# Patient Record
Sex: Female | Born: 1940 | Race: White | Hispanic: No | Smoking: Never smoker
Health system: Southern US, Community
[De-identification: ages and names within clinical notes are randomized; demographics above are authoritative.]

## PROBLEM LIST (undated history)

## (undated) DIAGNOSIS — K219 Gastro-esophageal reflux disease without esophagitis: Secondary | ICD-10-CM

## (undated) DIAGNOSIS — Z8719 Personal history of other diseases of the digestive system: Secondary | ICD-10-CM

## (undated) DIAGNOSIS — T8859XA Other complications of anesthesia, initial encounter: Secondary | ICD-10-CM

## (undated) DIAGNOSIS — T4145XA Adverse effect of unspecified anesthetic, initial encounter: Secondary | ICD-10-CM

## (undated) DIAGNOSIS — E119 Type 2 diabetes mellitus without complications: Secondary | ICD-10-CM

## (undated) DIAGNOSIS — Z1211 Encounter for screening for malignant neoplasm of colon: Secondary | ICD-10-CM

## (undated) DIAGNOSIS — N189 Chronic kidney disease, unspecified: Secondary | ICD-10-CM

## (undated) DIAGNOSIS — D0510 Intraductal carcinoma in situ of unspecified breast: Secondary | ICD-10-CM

## (undated) DIAGNOSIS — M1990 Unspecified osteoarthritis, unspecified site: Secondary | ICD-10-CM

## (undated) DIAGNOSIS — Z9889 Other specified postprocedural states: Secondary | ICD-10-CM

## (undated) DIAGNOSIS — I10 Essential (primary) hypertension: Secondary | ICD-10-CM

## (undated) DIAGNOSIS — C801 Malignant (primary) neoplasm, unspecified: Secondary | ICD-10-CM

## (undated) DIAGNOSIS — R112 Nausea with vomiting, unspecified: Secondary | ICD-10-CM

## (undated) DIAGNOSIS — Z9221 Personal history of antineoplastic chemotherapy: Secondary | ICD-10-CM

## (undated) HISTORY — DX: Encounter for screening for malignant neoplasm of colon: Z12.11

## (undated) HISTORY — PX: NASAL FRACTURE SURGERY: SHX718

## (undated) HISTORY — DX: Intraductal carcinoma in situ of unspecified breast: D05.10

## (undated) HISTORY — PX: ABDOMINAL HYSTERECTOMY: SHX81

## (undated) HISTORY — DX: Type 2 diabetes mellitus without complications: E11.9

---
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
## 1980-06-04 HISTORY — PX: OTHER SURGICAL HISTORY: SHX169

## 1985-06-04 HISTORY — PX: CHOLECYSTECTOMY: SHX55

## 2005-06-07 ENCOUNTER — Ambulatory Visit: Payer: Self-pay | Admitting: Internal Medicine

## 2007-01-23 ENCOUNTER — Ambulatory Visit: Payer: Self-pay | Admitting: Internal Medicine

## 2009-02-01 ENCOUNTER — Ambulatory Visit: Payer: Self-pay | Admitting: Internal Medicine

## 2012-03-12 ENCOUNTER — Ambulatory Visit: Payer: Self-pay | Admitting: Internal Medicine

## 2013-11-01 DIAGNOSIS — E1122 Type 2 diabetes mellitus with diabetic chronic kidney disease: Secondary | ICD-10-CM | POA: Insufficient documentation

## 2013-11-01 DIAGNOSIS — I10 Essential (primary) hypertension: Secondary | ICD-10-CM | POA: Insufficient documentation

## 2013-11-01 DIAGNOSIS — E785 Hyperlipidemia, unspecified: Secondary | ICD-10-CM | POA: Insufficient documentation

## 2013-11-01 DIAGNOSIS — E1169 Type 2 diabetes mellitus with other specified complication: Secondary | ICD-10-CM | POA: Insufficient documentation

## 2013-11-01 DIAGNOSIS — M8580 Other specified disorders of bone density and structure, unspecified site: Secondary | ICD-10-CM | POA: Insufficient documentation

## 2014-06-04 DIAGNOSIS — C50919 Malignant neoplasm of unspecified site of unspecified female breast: Secondary | ICD-10-CM

## 2014-06-04 HISTORY — DX: Malignant neoplasm of unspecified site of unspecified female breast: C50.919

## 2014-06-04 HISTORY — PX: BREAST LUMPECTOMY: SHX2

## 2015-03-07 ENCOUNTER — Other Ambulatory Visit: Payer: Self-pay | Admitting: Internal Medicine

## 2015-03-07 DIAGNOSIS — Z1231 Encounter for screening mammogram for malignant neoplasm of breast: Secondary | ICD-10-CM

## 2015-03-15 ENCOUNTER — Other Ambulatory Visit: Payer: Self-pay | Admitting: Internal Medicine

## 2015-03-15 ENCOUNTER — Ambulatory Visit: Payer: Self-pay

## 2015-03-15 ENCOUNTER — Ambulatory Visit
Admission: RE | Admit: 2015-03-15 | Discharge: 2015-03-15 | Disposition: A | Discharge status: Home / Self Care | Payer: Medicare Other | Source: Ambulatory Visit | Attending: Internal Medicine | Admitting: Internal Medicine

## 2015-03-15 DIAGNOSIS — Z1231 Encounter for screening mammogram for malignant neoplasm of breast: Secondary | ICD-10-CM

## 2015-03-15 HISTORY — DX: Malignant (primary) neoplasm, unspecified: C80.1

## 2015-03-21 ENCOUNTER — Other Ambulatory Visit: Payer: Self-pay | Admitting: Internal Medicine

## 2015-03-21 DIAGNOSIS — R928 Other abnormal and inconclusive findings on diagnostic imaging of breast: Secondary | ICD-10-CM

## 2015-03-29 ENCOUNTER — Other Ambulatory Visit: Payer: Self-pay | Admitting: Internal Medicine

## 2015-03-29 DIAGNOSIS — N6489 Other specified disorders of breast: Secondary | ICD-10-CM

## 2015-03-29 DIAGNOSIS — R921 Mammographic calcification found on diagnostic imaging of breast: Secondary | ICD-10-CM

## 2015-04-12 ENCOUNTER — Ambulatory Visit
Admission: RE | Admit: 2015-04-12 | Discharge: 2015-04-12 | Disposition: A | Discharge status: Home / Self Care | Payer: Medicare Other | Source: Ambulatory Visit | Attending: Internal Medicine | Admitting: Internal Medicine

## 2015-04-12 DIAGNOSIS — R928 Other abnormal and inconclusive findings on diagnostic imaging of breast: Secondary | ICD-10-CM | POA: Diagnosis present

## 2015-04-12 DIAGNOSIS — N63 Unspecified lump in breast: Secondary | ICD-10-CM | POA: Diagnosis not present

## 2015-04-12 DIAGNOSIS — R921 Mammographic calcification found on diagnostic imaging of breast: Secondary | ICD-10-CM | POA: Diagnosis not present

## 2015-04-14 ENCOUNTER — Other Ambulatory Visit: Payer: Self-pay | Admitting: Internal Medicine

## 2015-04-14 DIAGNOSIS — N63 Unspecified lump in unspecified breast: Secondary | ICD-10-CM

## 2015-04-14 DIAGNOSIS — R921 Mammographic calcification found on diagnostic imaging of breast: Secondary | ICD-10-CM

## 2015-04-20 ENCOUNTER — Ambulatory Visit
Admission: RE | Admit: 2015-04-20 | Discharge: 2015-04-20 | Disposition: A | Discharge status: Home / Self Care | Payer: Medicare Other | Source: Ambulatory Visit | Attending: Internal Medicine | Admitting: Internal Medicine

## 2015-04-20 DIAGNOSIS — N63 Unspecified lump in unspecified breast: Secondary | ICD-10-CM

## 2015-04-20 DIAGNOSIS — D0581 Other specified type of carcinoma in situ of right breast: Secondary | ICD-10-CM | POA: Insufficient documentation

## 2015-04-20 DIAGNOSIS — R921 Mammographic calcification found on diagnostic imaging of breast: Secondary | ICD-10-CM

## 2015-04-20 HISTORY — PX: BREAST BIOPSY: SHX20

## 2015-04-22 LAB — SURGICAL PATHOLOGY

## 2015-04-25 ENCOUNTER — Encounter: Payer: Self-pay | Admitting: *Deleted

## 2015-05-02 ENCOUNTER — Encounter: Payer: Self-pay | Admitting: General Surgery

## 2015-05-02 ENCOUNTER — Ambulatory Visit: Payer: Medicare Other

## 2015-05-02 ENCOUNTER — Ambulatory Visit (INDEPENDENT_AMBULATORY_CARE_PROVIDER_SITE_OTHER): Payer: Medicare Other | Admitting: General Surgery

## 2015-05-02 VITALS — BP 138/78 | HR 76 | Resp 14 | Ht 64.0 in | Wt 198.0 lb

## 2015-05-02 DIAGNOSIS — D0511 Intraductal carcinoma in situ of right breast: Secondary | ICD-10-CM | POA: Diagnosis not present

## 2015-05-02 NOTE — Patient Instructions (Signed)
Patient's surgery has been scheduled for 05-09-15 at [facility]. It is okay for patient to continue 81 mg aspirin once daily.

## 2015-05-02 NOTE — Progress Notes (Signed)
Patient ID: Janet Schwartz, female   DOB: 28-May-1941, 74 y.o.   MRN: [id number]  Chief Complaint  Patient presents with  . Other    right breast mass    HPI Janet Schwartz is a 74 y.o. female here for discussion of biopsy results. Her last mammogram was on 03/15/15, with added views done. She had a two right breast stereo biopsies done on 04/20/15. She denies any breast pain or tenderness. She did not feel anything in her breast.  There was a 3 year interval between mammograms. She is accompanied by her daughter, Janet Schwartz, who was present for the interview and exam.   I personally confirmed the patient's history. HPI  Past Medical History  Diagnosis Date  . Diabetes mellitus without complication ([place])   . 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 Breast biopsy Right 04/20/2015    stereo & u/s biopsy  . Bladder tach  1982  . Cholecystectomy  1987    Dr Bary Castilla    No family history on file.  Social History Social History  Substance Use Topics  . Smoking status: Never Smoker   . Smokeless tobacco: Never Used  . Alcohol Use: No    Allergies  Allergen Reactions  . Ace Inhibitors Swelling    Current Outpatient Prescriptions  Medication Sig Dispense Refill  . aspirin EC 81 MG tablet Take by mouth.    Marland Kitchen atorvastatin (LIPITOR) 40 MG tablet     . glimepiride (AMARYL) 2 MG tablet     . losartan-hydrochlorothiazide (HYZAAR) 100-12.5 MG tablet Take by mouth.     No current facility-administered medications for this visit.    Review of Systems Review of Systems  Constitutional: Negative.   Respiratory: Negative.   Cardiovascular: Negative.     Blood pressure 138/78, pulse 76, resp. rate 14, height 5\' 4"  (1.626 m), weight 198 lb (89.812 kg).  Physical Exam Physical Exam  Constitutional: She is oriented to person, place, and time. She appears well-developed and well-nourished.  Eyes: Conjunctivae are normal. No  scleral icterus.  Neck: Neck supple.  Cardiovascular: Normal rate, regular rhythm and normal heart sounds.   Pulmonary/Chest: Effort normal and breath sounds normal. Right breast exhibits no inverted nipple, no mass, no nipple discharge, no skin change and no tenderness. Left breast exhibits no inverted nipple, no mass, no nipple discharge, no skin change and no tenderness.  Right breast bruises at 2 and 10 o'clk   Lymphadenopathy:    She has no cervical adenopathy.    She has no axillary adenopathy.  Neurological: She is alert and oriented to person, place, and time.  Skin: Skin is warm and dry.  Psychiatric: She has a normal mood and affect.    Data Reviewed 2013-2016 mammograms were reviewed.  Biopsy results from the upper inner quadrant of the right breast showed intermediate grade DCIS.  Biopsy results from the upper outer quadrant lesion showed no malignancy.  Ultrasound examination of the upper inner quadrant of the right breast was done to help determine location of the biopsy site. There is an ill-defined hypoechoic area with focal acoustic shadowing measuring 1.0 x 1.1 x 1.4 cm in the 2:00 position of the right breast 12 cm from the nipple. This is probably approximately the 2 cm depth. BI-RADS-6  Laboratory studies from her PCP office of 02/28/2015 showed a creatinine of 1.1 with an estimated GFR of 46. Normal electrolytes. Hemoglobin A1c  7.1.     Assessment    DCIS right breast.    Plan    The ultrasound completed today was compared to her post biopsy mammogram, and majority of the residual calcifications remain anterior and lateral to the biopsy site.The area of microcalcifications covers approximately 4 cm. She'll be best served by bracketing wires to completely remove this area.   The indication for wide excision to confirm no upstaging to invasive cancer was reviewed. The potential role for antiestrogen therapy should she be found to have an estrogen sensitive tumor  was discussed. At this time the role for post wide excision radiation therapy is somewhat influx, but she'll likely benefit from formal consultation for radiation oncology. She reports that her family lives into their late 45s or early 71s and for that reason we need to look at a 10+ year disease-free treatment plan.  Based on information available at this time there is no indication that adjuvant chemotherapy would be of benefit.  Approximately 45 minutes was spent reviewing options for management.  Patient's surgery has been scheduled for 05-09-15 at [facility]. It is okay for patient to continue 81 mg aspirin once daily.     PCP:  Sol Passer 05/02/2015, 4:47 PM

## 2015-05-02 NOTE — H&P (Signed)
HPI  Janet Schwartz is a 74 y.o. female here for discussion of biopsy results. Her last mammogram was on 03/15/15, with added views done. She had a two right breast stereo biopsies done on 04/20/15. She denies any breast pain or tenderness. She did not feel anything in her breast.  There was a 3 year interval between mammograms.  She is accompanied by her daughter, Joselyn Glassman, who was present for the interview and exam.  I personally confirmed the patient's history.  HPI  Past Medical History   Diagnosis  Date   .  Diabetes mellitus without complication ([place])    .  Cancer [facility])  70's     cervical    Past Surgical History   Procedure  Laterality  Date   .  Breast biopsy  Right  04/20/2015     stereo & u/s biopsy   .  Bladder tach   1982   .  Cholecystectomy   1987     Dr Bary Castilla    No family history on file.  Social History  Social History   Substance Use Topics   .  Smoking status:  Never Smoker   .  Smokeless tobacco:  Never Used   .  Alcohol Use:  No    Allergies   Allergen  Reactions   .  Ace Inhibitors  Swelling    Current Outpatient Prescriptions   Medication  Sig  Dispense  Refill   .  aspirin EC 81 MG tablet  Take by mouth.     Marland Kitchen  atorvastatin (LIPITOR) 40 MG tablet      .  glimepiride (AMARYL) 2 MG tablet      .  losartan-hydrochlorothiazide (HYZAAR) 100-12.5 MG tablet  Take by mouth.      No current facility-administered medications for this visit.    Review of Systems  Review of Systems  Constitutional: Negative.  Respiratory: Negative.  Cardiovascular: Negative.   Blood pressure 138/78, pulse 76, resp. rate 14, height 5\' 4"  (1.626 m), weight 198 lb (89.812 kg).  Physical Exam  Physical Exam  Constitutional: She is oriented to person, place, and time. She appears well-developed and well-nourished.  Eyes: Conjunctivae are normal. No scleral icterus.  Neck: Neck supple.  Cardiovascular: Normal rate, regular rhythm and normal heart sounds.   Pulmonary/Chest: Effort normal and breath sounds normal. Right breast exhibits no inverted nipple, no mass, no nipple discharge, no skin change and no tenderness. Left breast exhibits no inverted nipple, no mass, no nipple discharge, no skin change and no tenderness.  Right breast bruises at 2 and 10 o'clk  Lymphadenopathy:  She has no cervical adenopathy.  She has no axillary adenopathy.  Neurological: She is alert and oriented to person, place, and time.  Skin: Skin is warm and dry.  Psychiatric: She has a normal mood and affect.   Data Reviewed  2013-2016 mammograms were reviewed.  Biopsy results from the upper inner quadrant of the right breast showed intermediate grade DCIS.  Biopsy results from the upper outer quadrant lesion showed no malignancy.  Ultrasound examination of the upper inner quadrant of the right breast was done to help determine location of the biopsy site. There is an ill-defined hypoechoic area with focal acoustic shadowing measuring 1.0 x 1.1 x 1.4 cm in the 2:00 position of the right breast 12 cm from the nipple. This is probably approximately the 2 cm depth. BI-RADS-6  Laboratory studies from her PCP office of 02/28/2015 showed a creatinine  of 1.1 with an estimated GFR of 46. Normal electrolytes. Hemoglobin A1c 7.1.  Assessment   DCIS right breast.   Plan   The ultrasound completed today was compared to her post biopsy mammogram, and majority of the residual calcifications remain anterior and lateral to the biopsy site.The area of microcalcifications covers approximately 4 cm. She'll be best served by bracketing wires to completely remove this area.  The indication for wide excision to confirm no upstaging to invasive cancer was reviewed. The potential role for antiestrogen therapy should she be found to have an estrogen sensitive tumor was discussed. At this time the role for post wide excision radiation therapy is somewhat influx, but she'll likely benefit from  formal consultation for radiation oncology. She reports that her family lives into their late 67s or early 57s and for that reason we need to look at a 10+ year disease-free treatment plan.  Based on information available at this time there is no indication that adjuvant chemotherapy would be of benefit.  Approximately 45 minutes was spent reviewing options for management.  Patient's surgery has been scheduled for 05-09-15 at [facility]. It is okay for patient to continue 81 mg aspirin once daily.   PCP: Sol Passer  05/02/2015, 4:47 PM

## 2015-05-03 ENCOUNTER — Telehealth: Payer: Self-pay

## 2015-05-03 NOTE — Telephone Encounter (Signed)
Phoned patient to introduce navigation service. Took Breast Cancer Treatment Handbook/folder with hospital services to Dr. Dwyane Luo office yesterday while patient was with him for initial surgery consult.  She is scheduled for lumpectomy on 05/09/15.  Reviewed family history, and risk data for Breast Case [facility]  Oncology Nurse Navigator Documentation    Navigator Encounter Type: Introductory phone call (05/03/15 1000) Patient Visit Type: Initial (05/03/15 1000) Treatment Phase:  (Surgery) (05/03/15 1000) Barriers/Navigation Needs: No barriers at this time (05/03/15 1000)   Interventions: Education Method (05/03/15 1000)     Education Method: Verbal (05/03/15 1000)      Time Spent with Patient: 30 (05/03/15 1000)   ce.

## 2015-05-04 ENCOUNTER — Other Ambulatory Visit: Payer: Medicare Other

## 2015-05-04 ENCOUNTER — Other Ambulatory Visit: Payer: Self-pay | Admitting: General Surgery

## 2015-05-04 ENCOUNTER — Encounter: Payer: Self-pay | Admitting: *Deleted

## 2015-05-04 DIAGNOSIS — C50911 Malignant neoplasm of unspecified site of right female breast: Secondary | ICD-10-CM

## 2015-05-06 ENCOUNTER — Encounter
Admission: RE | Admit: 2015-05-06 | Discharge: 2015-05-06 | Disposition: A | Discharge status: Home / Self Care | Payer: Medicare Other | Source: Ambulatory Visit | Attending: Anesthesiology | Admitting: Anesthesiology

## 2015-05-06 ENCOUNTER — Encounter: Payer: Self-pay | Admitting: *Deleted

## 2015-05-06 DIAGNOSIS — E119 Type 2 diabetes mellitus without complications: Secondary | ICD-10-CM | POA: Diagnosis not present

## 2015-05-06 DIAGNOSIS — K449 Diaphragmatic hernia without obstruction or gangrene: Secondary | ICD-10-CM | POA: Diagnosis not present

## 2015-05-06 DIAGNOSIS — I129 Hypertensive chronic kidney disease with stage 1 through stage 4 chronic kidney disease, or unspecified chronic kidney disease: Secondary | ICD-10-CM | POA: Diagnosis not present

## 2015-05-06 DIAGNOSIS — D0511 Intraductal carcinoma in situ of right breast: Secondary | ICD-10-CM | POA: Diagnosis present

## 2015-05-06 DIAGNOSIS — Z8541 Personal history of malignant neoplasm of cervix uteri: Secondary | ICD-10-CM | POA: Diagnosis not present

## 2015-05-06 DIAGNOSIS — N183 Chronic kidney disease, stage 3 (moderate): Secondary | ICD-10-CM | POA: Diagnosis not present

## 2015-05-06 DIAGNOSIS — E1122 Type 2 diabetes mellitus with diabetic chronic kidney disease: Secondary | ICD-10-CM | POA: Diagnosis not present

## 2015-05-06 DIAGNOSIS — R920 Mammographic microcalcification found on diagnostic imaging of breast: Secondary | ICD-10-CM | POA: Diagnosis not present

## 2015-05-06 DIAGNOSIS — Z79899 Other long term (current) drug therapy: Secondary | ICD-10-CM | POA: Diagnosis not present

## 2015-05-06 DIAGNOSIS — Z170 Estrogen receptor positive status [ER+]: Secondary | ICD-10-CM | POA: Diagnosis not present

## 2015-05-06 DIAGNOSIS — K219 Gastro-esophageal reflux disease without esophagitis: Secondary | ICD-10-CM | POA: Diagnosis not present

## 2015-05-06 DIAGNOSIS — Z9049 Acquired absence of other specified parts of digestive tract: Secondary | ICD-10-CM | POA: Diagnosis not present

## 2015-05-06 DIAGNOSIS — Z7982 Long term (current) use of aspirin: Secondary | ICD-10-CM | POA: Diagnosis not present

## 2015-05-06 LAB — BASIC METABOLIC PANEL
ANION GAP: 7 (ref 5–15)
BUN: 24 mg/dL — ABNORMAL HIGH (ref 6–20)
CO2: 26 mmol/L (ref 22–32)
Calcium: 9.1 mg/dL (ref 8.9–10.3)
Chloride: 106 mmol/L (ref 101–111)
Creatinine, Ser: 0.99 mg/dL (ref 0.44–1.00)
GFR, EST NON AFRICAN AMERICAN: 54 mL/min — AB (ref >60)
Glucose, Bld: 113 mg/dL — ABNORMAL HIGH (ref 65–99)
POTASSIUM: 3.7 mmol/L (ref 3.5–5.1)
SODIUM: 139 mmol/L (ref 135–145)

## 2015-05-06 NOTE — Patient Instructions (Signed)
  Your procedure is scheduled on: 05-09-15 (MONDAY) Report to [place] @ 7:45 AM PER PT   Remember: Instructions that are not followed completely may result in serious medical risk, up to and including death, or upon the discretion of your surgeon and anesthesiologist your surgery may need to be rescheduled.    _X___ 1. Do not eat food or drink liquids after midnight. No gum chewing or hard candies.     _X___ 2. No Alcohol for 24 hours before or after surgery.   ____ 3. Bring all medications with you on the day of surgery if instructed.    _X___ 4. Notify your doctor if there is any change in your medical condition     (cold, fever, infections).     Do not wear jewelry, make-up, hairpins, clips or nail polish.  Do not wear lotions, powders, or perfumes. You may wear deodorant.  Do not shave 48 hours prior to surgery. Men may shave face and neck.  Do not bring valuables to the hospital.    [facility] is not responsible for any belongings or valuables.               Contacts, dentures or bridgework may not be worn into surgery.  Leave your suitcase in the car. After surgery it may be brought to your room.  For patients admitted to the hospital, discharge time is determined by your treatment team.   Patients discharged the day of surgery will not be allowed to drive home.   Please read over the following fact sheets that you were given:     _X_ Take these medicines the morning of surgery with A SIP OF WATER:    1. ATORVASTATIN (LIPITOR)  2.   3.   4.  5.  6.  ____ Fleet Enema (as directed)   _X___ Use CHG Soap as directed  ____ Use inhalers on the day of surgery  ____ Stop metformin 2 days prior to surgery    ____ Take 1/2 of usual insulin dose the night before surgery and none on the morning of surgery.   ____ Stop Coumadin/Plavix/aspirin-OK TO CONTINUE 81 MG ASPIRIN PER DR BYRNETT-DO NOT TAKE ASPIRIN DAY OF SURGERY  ____ Stop  Anti-inflammatories   ____ Stop supplements until after surgery.    ____ Bring C-Pap to the hospital.

## 2015-05-09 ENCOUNTER — Ambulatory Visit
Admission: RE | Admit: 2015-05-09 | Discharge: 2015-05-09 | Disposition: A | Discharge status: Home / Self Care | Payer: Medicare Other | Source: Ambulatory Visit | Attending: General Surgery | Admitting: General Surgery

## 2015-05-09 ENCOUNTER — Ambulatory Visit: Payer: Medicare Other | Admitting: Anesthesiology

## 2015-05-09 ENCOUNTER — Encounter
Admission: RE | Disposition: A | Discharge status: Home / Self Care | Payer: Self-pay | Source: Ambulatory Visit | Attending: General Surgery

## 2015-05-09 ENCOUNTER — Encounter: Payer: Self-pay | Admitting: *Deleted

## 2015-05-09 DIAGNOSIS — Z7982 Long term (current) use of aspirin: Secondary | ICD-10-CM | POA: Insufficient documentation

## 2015-05-09 DIAGNOSIS — Z8541 Personal history of malignant neoplasm of cervix uteri: Secondary | ICD-10-CM | POA: Insufficient documentation

## 2015-05-09 DIAGNOSIS — D0510 Intraductal carcinoma in situ of unspecified breast: Secondary | ICD-10-CM

## 2015-05-09 DIAGNOSIS — K219 Gastro-esophageal reflux disease without esophagitis: Secondary | ICD-10-CM | POA: Insufficient documentation

## 2015-05-09 DIAGNOSIS — K449 Diaphragmatic hernia without obstruction or gangrene: Secondary | ICD-10-CM | POA: Insufficient documentation

## 2015-05-09 DIAGNOSIS — C50411 Malignant neoplasm of upper-outer quadrant of right female breast: Secondary | ICD-10-CM

## 2015-05-09 DIAGNOSIS — Z9049 Acquired absence of other specified parts of digestive tract: Secondary | ICD-10-CM | POA: Insufficient documentation

## 2015-05-09 DIAGNOSIS — D0511 Intraductal carcinoma in situ of right breast: Secondary | ICD-10-CM | POA: Insufficient documentation

## 2015-05-09 DIAGNOSIS — C50911 Malignant neoplasm of unspecified site of right female breast: Secondary | ICD-10-CM

## 2015-05-09 DIAGNOSIS — E1122 Type 2 diabetes mellitus with diabetic chronic kidney disease: Secondary | ICD-10-CM | POA: Insufficient documentation

## 2015-05-09 DIAGNOSIS — N183 Chronic kidney disease, stage 3 (moderate): Secondary | ICD-10-CM | POA: Insufficient documentation

## 2015-05-09 DIAGNOSIS — R920 Mammographic microcalcification found on diagnostic imaging of breast: Secondary | ICD-10-CM | POA: Insufficient documentation

## 2015-05-09 DIAGNOSIS — E119 Type 2 diabetes mellitus without complications: Secondary | ICD-10-CM | POA: Insufficient documentation

## 2015-05-09 DIAGNOSIS — I129 Hypertensive chronic kidney disease with stage 1 through stage 4 chronic kidney disease, or unspecified chronic kidney disease: Secondary | ICD-10-CM | POA: Insufficient documentation

## 2015-05-09 DIAGNOSIS — Z79899 Other long term (current) drug therapy: Secondary | ICD-10-CM | POA: Insufficient documentation

## 2015-05-09 DIAGNOSIS — Z170 Estrogen receptor positive status [ER+]: Secondary | ICD-10-CM | POA: Insufficient documentation

## 2015-05-09 HISTORY — DX: Nausea with vomiting, unspecified: R11.2

## 2015-05-09 HISTORY — DX: Essential (primary) hypertension: I10

## 2015-05-09 HISTORY — DX: Gastro-esophageal reflux disease without esophagitis: K21.9

## 2015-05-09 HISTORY — DX: Intraductal carcinoma in situ of unspecified breast: D05.10

## 2015-05-09 HISTORY — PX: BREAST LUMPECTOMY WITH NEEDLE LOCALIZATION: SHX5759

## 2015-05-09 HISTORY — DX: Personal history of other diseases of the digestive system: Z87.19

## 2015-05-09 HISTORY — DX: Other specified postprocedural states: Z98.890

## 2015-05-09 HISTORY — DX: Other complications of anesthesia, initial encounter: T88.59XA

## 2015-05-09 HISTORY — DX: Adverse effect of unspecified anesthetic, initial encounter: T41.45XA

## 2015-05-09 HISTORY — DX: Chronic kidney disease, unspecified: N18.9

## 2015-05-09 LAB — GLUCOSE, CAPILLARY
Glucose-Capillary: 161 mg/dL — ABNORMAL HIGH (ref 65–99)
Glucose-Capillary: 190 mg/dL — ABNORMAL HIGH (ref 65–99)

## 2015-05-09 SURGERY — BREAST LUMPECTOMY WITH NEEDLE LOCALIZATION
Anesthesia: General | Laterality: Right | Wound class: Clean

## 2015-05-09 MED ORDER — MIDAZOLAM HCL 2 MG/2ML IJ SOLN
INTRAMUSCULAR | Status: DC | PRN
  Administered 2015-05-09: 2 mg via INTRAVENOUS

## 2015-05-09 MED ORDER — KETOROLAC TROMETHAMINE 30 MG/ML IJ SOLN
INTRAMUSCULAR | Status: DC | PRN
  Administered 2015-05-09: 30 mg via INTRAVENOUS

## 2015-05-09 MED ORDER — ACETAMINOPHEN 10 MG/ML IV SOLN
INTRAVENOUS | Status: AC
  Filled 2015-05-09: qty 100

## 2015-05-09 MED ORDER — OXYCODONE HCL 5 MG/5ML PO SOLN: 5.0000 mg | Freq: Once | ORAL | Status: DC | PRN

## 2015-05-09 MED ORDER — ONDANSETRON HCL 4 MG/2ML IJ SOLN
INTRAMUSCULAR | Status: DC | PRN
  Administered 2015-05-09: 4 mg via INTRAVENOUS

## 2015-05-09 MED ORDER — BUPIVACAINE-EPINEPHRINE (PF) 0.5% -1:200000 IJ SOLN
INTRAMUSCULAR | Status: AC
  Filled 2015-05-09: qty 30

## 2015-05-09 MED ORDER — HYDROCODONE-ACETAMINOPHEN 5-325 MG PO TABS: 1.0000 | ORAL_TABLET | Freq: Four times a day (QID) | ORAL | Status: DC | PRN

## 2015-05-09 MED ORDER — FAMOTIDINE 20 MG PO TABS
20.0000 mg | ORAL_TABLET | Freq: Once | ORAL | Status: AC
  Administered 2015-05-09: 20 mg via ORAL

## 2015-05-09 MED ORDER — FAMOTIDINE 20 MG PO TABS
ORAL_TABLET | ORAL | Status: AC
  Administered 2015-05-09: 20 mg via ORAL
  Filled 2015-05-09: qty 1

## 2015-05-09 MED ORDER — FENTANYL CITRATE (PF) 100 MCG/2ML IJ SOLN
INTRAMUSCULAR | Status: DC | PRN
  Administered 2015-05-09 (×2): 50 ug via INTRAVENOUS

## 2015-05-09 MED ORDER — FENTANYL CITRATE (PF) 100 MCG/2ML IJ SOLN: 25.0000 ug | INTRAMUSCULAR | Status: DC | PRN

## 2015-05-09 MED ORDER — OXYCODONE HCL 5 MG PO TABS: 5.0000 mg | ORAL_TABLET | Freq: Once | ORAL | Status: DC | PRN

## 2015-05-09 MED ORDER — LIDOCAINE HCL (CARDIAC) 20 MG/ML IV SOLN
INTRAVENOUS | Status: DC | PRN
  Administered 2015-05-09: 100 mg via INTRAVENOUS

## 2015-05-09 MED ORDER — ACETAMINOPHEN 10 MG/ML IV SOLN
INTRAVENOUS | Status: DC | PRN
  Administered 2015-05-09: 1000 mg via INTRAVENOUS

## 2015-05-09 MED ORDER — SODIUM CHLORIDE 0.9 % IV SOLN
INTRAVENOUS | Status: DC
  Administered 2015-05-09: 09:00:00 via INTRAVENOUS

## 2015-05-09 MED ORDER — BUPIVACAINE-EPINEPHRINE (PF) 0.5% -1:200000 IJ SOLN
INTRAMUSCULAR | Status: DC | PRN
  Administered 2015-05-09: 30 mL

## 2015-05-09 MED ORDER — SODIUM CHLORIDE 0.9 % IV SOLN: INTRAVENOUS | Status: DC | PRN

## 2015-05-09 MED ORDER — DEXAMETHASONE SODIUM PHOSPHATE 4 MG/ML IJ SOLN
INTRAMUSCULAR | Status: DC | PRN
  Administered 2015-05-09: 10 mg via INTRAVENOUS

## 2015-05-09 MED ORDER — GLYCOPYRROLATE 0.2 MG/ML IJ SOLN
INTRAMUSCULAR | Status: DC | PRN
  Administered 2015-05-09: 0.2 mg via INTRAVENOUS

## 2015-05-09 MED ORDER — PROPOFOL 10 MG/ML IV BOLUS
INTRAVENOUS | Status: DC | PRN
  Administered 2015-05-09: 150 mg via INTRAVENOUS
  Administered 2015-05-09: 50 mg via INTRAVENOUS

## 2015-05-09 MED ORDER — EPHEDRINE SULFATE 50 MG/ML IJ SOLN
INTRAMUSCULAR | Status: DC | PRN
  Administered 2015-05-09: 15 mg via INTRAVENOUS

## 2015-05-09 SURGICAL SUPPLY — 46 items
BANDAGE ELASTIC 6 LF NS (GAUZE/BANDAGES/DRESSINGS) ×3 IMPLANT
BLADE SURG 15 STRL SS SAFETY (BLADE) ×3 IMPLANT
BNDG GAUZE 4.5X4.1 6PLY STRL (MISCELLANEOUS) ×3 IMPLANT
BULB RESERV EVAC DRAIN JP 100C (MISCELLANEOUS) IMPLANT
CANISTER SUCT 1200ML W/VALVE (MISCELLANEOUS) ×3 IMPLANT
CHLORAPREP W/TINT 26ML (MISCELLANEOUS) ×3 IMPLANT
CLOSURE WOUND 1/2 X4 (GAUZE/BANDAGES/DRESSINGS) ×1
CNTNR SPEC 2.5X3XGRAD LEK (MISCELLANEOUS)
CONT SPEC 4OZ STER OR WHT (MISCELLANEOUS)
CONTAINER SPEC 2.5X3XGRAD LEK (MISCELLANEOUS) IMPLANT
COVER PROBE FLX POLY STRL (MISCELLANEOUS) ×3 IMPLANT
DRAIN CHANNEL JP 15F RND 16 (MISCELLANEOUS) IMPLANT
DRAPE LAPAROTOMY TRNSV 106X77 (MISCELLANEOUS) ×3 IMPLANT
DRSG TELFA 3X8 NADH (GAUZE/BANDAGES/DRESSINGS) ×3 IMPLANT
ELECT CAUTERY BLADE TIP 2.5 (TIP) ×3
ELECTRODE CAUTERY BLDE TIP 2.5 (TIP) ×1 IMPLANT
GAUZE FLUFF 18X24 1PLY STRL (GAUZE/BANDAGES/DRESSINGS) ×3 IMPLANT
GAUZE SPONGE 4X4 12PLY STRL (GAUZE/BANDAGES/DRESSINGS) IMPLANT
GLOVE BIO SURGEON STRL SZ7.5 (GLOVE) ×6 IMPLANT
GLOVE INDICATOR 8.0 STRL GRN (GLOVE) ×6 IMPLANT
GOWN STRL REUS W/ TWL LRG LVL3 (GOWN DISPOSABLE) ×2 IMPLANT
GOWN STRL REUS W/TWL LRG LVL3 (GOWN DISPOSABLE) ×4
HARMONIC SCALPEL FOCUS (MISCELLANEOUS) IMPLANT
KIT RM TURNOVER STRD PROC AR (KITS) ×3 IMPLANT
LABEL OR SOLS (LABEL) ×3 IMPLANT
MARGIN MAP 10MM (MISCELLANEOUS) ×3 IMPLANT
NDL SAFETY 22GX1.5 (NEEDLE) ×3 IMPLANT
NEEDLE HYPO 25X1 1.5 SAFETY (NEEDLE) IMPLANT
PACK BASIN MINOR ARMC (MISCELLANEOUS) ×3 IMPLANT
PAD GROUND ADULT SPLIT (MISCELLANEOUS) ×3 IMPLANT
SHEARS FOC LG CVD HARMONIC 17C (MISCELLANEOUS) IMPLANT
STRIP CLOSURE SKIN 1/2X4 (GAUZE/BANDAGES/DRESSINGS) ×2 IMPLANT
SUT ETHILON 3-0 FS-10 30 BLK (SUTURE) ×3
SUT SILK 2 0 (SUTURE)
SUT SILK 2-0 18XBRD TIE 12 (SUTURE) IMPLANT
SUT VIC AB 2-0 CT1 27 (SUTURE) ×4
SUT VIC AB 2-0 CT1 TAPERPNT 27 (SUTURE) ×2 IMPLANT
SUT VIC AB 4-0 FS2 27 (SUTURE) ×3 IMPLANT
SUT VICRYL+ 3-0 144IN (SUTURE) ×3 IMPLANT
SUTURE EHLN 3-0 FS-10 30 BLK (SUTURE) ×1 IMPLANT
SWABSTK COMLB BENZOIN TINCTURE (MISCELLANEOUS) ×3 IMPLANT
SYR BULB IRRIG 60ML STRL (SYRINGE) ×3 IMPLANT
SYR CONTROL 10ML (SYRINGE) IMPLANT
SYRINGE 10CC LL (SYRINGE) ×3 IMPLANT
TAPE TRANSPORE STRL 2 31045 (GAUZE/BANDAGES/DRESSINGS) IMPLANT
WATER STERILE IRR 1000ML POUR (IV SOLUTION) ×3 IMPLANT

## 2015-05-09 NOTE — Anesthesia Postprocedure Evaluation (Signed)
Anesthesia Post Note  Patient: NOVIS KEMPE  Procedure(s) Performed: Procedure(s) (LRB): BREAST LUMPECTOMY WITH NEEDLE LOCALIZATION (Right)  Patient location during evaluation: PACU Anesthesia Type: General Level of consciousness: awake and alert Pain management: pain level controlled Vital Signs Assessment: post-procedure vital signs reviewed and stable Respiratory status: spontaneous breathing, nonlabored ventilation, respiratory function stable and patient connected to nasal cannula oxygen Cardiovascular status: blood pressure returned to baseline and stable Postop Assessment: no signs of nausea or vomiting Anesthetic complications: no    Last Vitals:  Filed Vitals:   05/09/15 1148 05/09/15 1227  BP: 129/71 130/58  Pulse: 80 80  Temp: 36.4 C   Resp: 16 18    Last Pain:  Filed Vitals:   05/09/15 1229  PainSc: 0-No pain                 Precious Haws Lovina Zuver

## 2015-05-09 NOTE — Anesthesia Procedure Notes (Signed)
Procedure Name: LMA Insertion Date/Time: 05/09/2015 9:49 AM Performed by: Doreen Salvage Pre-anesthesia Checklist: Patient identified, Patient being monitored, Timeout performed, Emergency Drugs available and Suction available Patient Re-evaluated:Patient Re-evaluated prior to inductionOxygen Delivery Method: Circle system utilized Preoxygenation: Pre-oxygenation with 100% oxygen Intubation Type: IV induction Ventilation: Mask ventilation without difficulty LMA: LMA inserted LMA Size: 3.5 Tube type: Oral Number of attempts: 1 Placement Confirmation: positive ETCO2 and breath sounds checked- equal and bilateral Tube secured with: Tape Dental Injury: Teeth and Oropharynx as per pre-operative assessment

## 2015-05-09 NOTE — Discharge Instructions (Signed)
AMBULATORY SURGERY  DISCHARGE INSTRUCTIONS   1) The drugs that you were given will stay in your system until tomorrow so for the next 24 hours you should not:  A) Drive an automobile B) Make any legal decisions C) Drink any alcoholic beverage   2) You may resume regular meals tomorrow.  Today it is better to start with liquids and gradually work up to solid foods.  You may eat anything you prefer, but it is better to start with liquids, then soup and crackers, and gradually work up to solid foods.   3) Please notify your doctor immediately if you have any unusual bleeding, trouble breathing, redness and pain at the surgery site, drainage, fever, or pain not relieved by medication.    4) Additional Instructions:    Lumpectomy, Care After Refer to this sheet in the next few weeks. These instructions provide you with information on caring for yourself after your procedure. Your health care provider may also give you more specific instructions. Your treatment has been planned according to current medical practices, but problems sometimes occur. Call your health care provider if you have any problems or questions after your procedure. WHAT TO EXPECT AFTER THE PROCEDURE After your procedure, it is typical to have soreness, bruising, and swelling of your breast. This is normal. You will be given medicines to control your pain. HOME CARE INSTRUCTIONS  Take medicines only as directed by your health care provider.  Resume a normal diet as directed by your health care provider.  Resume normal activity as directed by your health care provider. Avoid strenuous activity that affects the arm on the side that the surgical cut (incision) was made. Avoid playing tennis, swimming, lifting heavy objects (those that weigh more than 10 pounds [4.5 kg]), and pulling for 2 weeks.  Change bandages (dressings) as directed by your health care provider.  Consider wearing a bra to bed if you feel discomfort  at the breast. Wearing a bra also helps keep dressings on.  Keep all follow-up visits as directed by your health care provider. This is important.  Call for the results of your procedure as instructed by your surgeon. It is your responsibility to get your test results. Do not assume everything is fine if you have not heard from your health care provider.  Keep the incision site dry.  If the incision site is tender, applying an ice pack may relieve some discomfort. To do this:  Put ice in a plastic bag.  Place a towel between your skin and the bag.  Leave the ice on for 15-20 minutes, 3-4 times a day. SEEK MEDICAL CARE IF:   You have increased bleeding from the incision site.  You notice redness, swelling, or increasing pain in the incision.  You have pus coming from the incision site.  You have a fever.  You notice a foul smell coming from the incision or dressing. SEEK IMMEDIATE MEDICAL CARE IF:   You develop a rash.  You have shortness of breath.  You have chest pain.   This information is not intended to replace advice given to you by your health care provider. Make sure you discuss any questions you have with your health care provider.   Document Released: 06/06/2006 Document Revised: 06/11/2014 Document Reviewed: 12/18/2012 Elsevier Interactive Patient Education 2016 [place].     Please contact your physician with any problems or Same Day Surgery at [phone], Monday through Friday 6 am to 4 pm, or [place] at  [place] [facility] number at [phone].

## 2015-05-09 NOTE — Op Note (Signed)
Preoperative diagnosis: DCIS right breast.  Postoperative diagnosis: Same.  Operative procedure: Wide local excision with wire localization, mastoplasty.  Operative surgeon: Ollen Bowl, M.D.  Anesthesia: Gen. by LMA, Marcaine 0.5% with 1-200,000 epinephrine, 30 mL.  Estimated blood loss: Less than 5 mL.  Clinical note: This 74 year old woman recently had a change in her mammogram and core biopsy showed evidence of DCIS. Mammography showed a 4-5 cm area of linear stranding of calcifications. She underwent bracketing wires prior to the procedure today for wide excision.  Operative note: With the patient under adequate general anesthesia the breast was prepped with ChloraPrep and draped. Ultrasound was used to identify the location of the wire tips. Marcaine was infiltrated for postoperative analgesia. The skin was incised sharply between 11 and 2:00 position with a curvilinear incision and the remaining dissection completed with cautery. This was extended down to the beginning of the breast parenchyma. The localizing wires were brought into the field. A 3 x 4 x 5 cm block of tissue was excised oriented and sent for specimen radiograph. This concluded both wire reflux as well as the previously placed biopsy marker. Report from pathology showed the anterior superficial margin closest to the old biopsy cavity at 2 mm. The dissection was carried posteriorly to and including the pectoralis fascia.  The breast was mobilized off the pectoralis muscle taking the fascia with the breast tissue. This was done circumferentially for 5 cm. The fascia was then approximated with interrupted 2-0 Vicryl figure-of-eight sutures. This reestablished the breast contour. The deep adipose layer was approximated with interrupted 2-0 Vicryl sutures. The skin was closed with a running 4-0 Vicryl septic suture. Benzoin, Steri-Strips, Telfa, fluff gauze, Kerlix and Ace wrap was applied.  The patient tolerated the procedure  well and was taken to recovery in stable condition.

## 2015-05-09 NOTE — Transfer of Care (Signed)
Immediate Anesthesia Transfer of Care Note  Patient: Janet Schwartz  Procedure(s) Performed: Procedure(s): BREAST LUMPECTOMY WITH NEEDLE LOCALIZATION (Right)  Patient Location: PACU  Anesthesia Type:General  Level of Consciousness: sedated  Airway & Oxygen Therapy: Patient Spontanous Breathing and Patient connected to face mask oxygen  Post-op Assessment: Report given to RN and Post -op Vital signs reviewed and stable  Post vital signs: Reviewed and stable  Last Vitals:  Filed Vitals:   05/09/15 0857 05/09/15 1103  BP: 102/81 144/62  Pulse: 71 92  Temp: 36.6 C 36.4 C  Resp: 18 17    Complications: No apparent anesthesia complications

## 2015-05-09 NOTE — H&P (Signed)
No change in clinical condition or plan.

## 2015-05-09 NOTE — Progress Notes (Signed)
   05/09/15 0915  Clinical Encounter Type  Visited With Patient and family together  Visit Type Initial  Provided pastoral presence and support to patient and family members in the pre-op area of same day surgery.  [place]

## 2015-05-09 NOTE — Anesthesia Preprocedure Evaluation (Signed)
Anesthesia Evaluation  Patient identified by MRN, date of birth, ID band Patient awake    Reviewed: Allergy & Precautions, H&P , NPO status , Patient's Chart, lab work & pertinent test results  History of Anesthesia Complications (+) PONV and history of anesthetic complications  Airway Mallampati: III  TM Distance: >3 FB Neck ROM: full    Dental  (+) Poor Dentition, Chipped, Missing   Pulmonary neg pulmonary ROS, neg shortness of breath,    Pulmonary exam normal breath sounds clear to auscultation       Cardiovascular Exercise Tolerance: Good hypertension, (-) angina(-) Past MI and (-) DOE Normal cardiovascular exam Rhythm:regular Rate:Normal     Neuro/Psych negative neurological ROS  negative psych ROS   GI/Hepatic Neg liver ROS, hiatal hernia, GERD  Controlled,  Endo/Other  diabetes, Type 2  Renal/GU Renal diseasenegative Renal ROS  negative genitourinary   Musculoskeletal   Abdominal   Peds  Hematology negative hematology ROS (+)   Anesthesia Other Findings Past Medical History:   Diabetes mellitus without complication (H[facility])                 Chronic kidney disease                                         Comment:STAGE 3 DUE TO DM   Hypertension                                                 Cancer ([place])                                    70's           Comment:cervical   History of hiatal hernia                                     GERD (gastroesophageal reflux disease)                         Comment:RELATED TO SPICY FOODS-NO MEDS   Complication of anesthesia                                   PONV (postoperative nausea and vomiting)                       Comment:H/O NAUSEA   Past Surgical History:   BREAST BIOPSY                                   Right 04/20/2015     Comment:stereo & u/s biopsy   bladder tach                                     1982         CHOLECYSTECTOMY  1987           Comment:Dr Byrnett   ABDOMINAL HYSTERECTOMY                                        NASAL FRACTURE SURGERY                                          Reproductive/Obstetrics negative OB ROS                             Anesthesia Physical Anesthesia Plan  ASA: III  Anesthesia Plan: General LMA   Post-op Pain Management:    Induction:   Airway Management Planned:   Additional Equipment:   Intra-op Plan:   Post-operative Plan:   Informed Consent: I have reviewed the patients History and Physical, chart, labs and discussed the procedure including the risks, benefits and alternatives for the proposed anesthesia with the patient or authorized representative who has indicated his/her understanding and acceptance.   Dental Advisory Given  Plan Discussed with: Anesthesiologist, CRNA and Surgeon  Anesthesia Plan Comments:         Anesthesia Quick Evaluation

## 2015-05-10 ENCOUNTER — Telehealth: Payer: Self-pay | Admitting: General Surgery

## 2015-05-10 NOTE — Telephone Encounter (Signed)
Notified of results. Will discuss details at follow up.

## 2015-05-12 LAB — SURGICAL PATHOLOGY

## 2015-05-12 NOTE — Progress Notes (Signed)
  Oncology Nurse Navigator Documentation    Navigator Encounter Type: Telephone (Post-op) (05/12/15 1500) Patient Visit Type: Surgery;Follow-up (05/12/15 1500)                    Time Spent with Patient: 15 (05/12/15 1500)   Patient states doing well post-op.  Follow-up appointment with Dr. Bary Castilla 05/16/15 per patient. She will call if radiation oncology appointment scheduled, so navigator can meet at that appointment.

## 2015-05-16 ENCOUNTER — Ambulatory Visit (INDEPENDENT_AMBULATORY_CARE_PROVIDER_SITE_OTHER): Payer: Medicare Other | Admitting: General Surgery

## 2015-05-16 ENCOUNTER — Encounter: Payer: Self-pay | Admitting: General Surgery

## 2015-05-16 VITALS — BP 120/60 | HR 68 | Resp 14 | Ht 64.0 in

## 2015-05-16 DIAGNOSIS — D0511 Intraductal carcinoma in situ of right breast: Secondary | ICD-10-CM

## 2015-05-16 MED ORDER — TAMOXIFEN CITRATE 20 MG PO TABS: 20.0000 mg | ORAL_TABLET | Freq: Every day | ORAL | Status: DC

## 2015-05-16 NOTE — Progress Notes (Signed)
Patient ID: Janet Schwartz, female   DOB: [birth year], 74 y.o.   MRN: [id number]  Chief Complaint  Patient presents with  . Routine Post Op    right lumpectomy     HPI Janet Schwartz is a 74 y.o. female here today for her p[ost op right lumpectomy done on 05/09/15. Patient states she is doing well.  HPI  Past Medical History  Diagnosis Date  . Diabetes mellitus without complication ([place])   . Chronic kidney disease     STAGE 3 DUE TO DM  . Hypertension   . History of hiatal hernia   . GERD (gastroesophageal reflux disease)     RELATED TO SPICY FOODS-NO MEDS  . Complication of anesthesia   . PONV (postoperative nausea and vomiting)     H/O NAUSEA   . 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 Wide excision.    Past Surgical History  Procedure Laterality Date  . Breast biopsy Right 04/20/2015    stereo & u/s biopsy  . Bladder tach  1982  . Cholecystectomy  1987    Dr Bary Castilla  . Abdominal hysterectomy    . Nasal fracture surgery    . Breast lumpectomy with needle localization Right 05/09/2015    Procedure: BREAST LUMPECTOMY WITH NEEDLE LOCALIZATION;  Surgeon: Robert Bellow, MD;  Location: [facility] ORS;  Service: General;  Laterality: Right;    No family history on file.  Social History Social History  Substance Use Topics  . Smoking status: Never Smoker   . Smokeless tobacco: Never Used  . Alcohol Use: No    Allergies  Allergen Reactions  . Ace Inhibitors Cough    Current Outpatient Prescriptions  Medication Sig Dispense Refill  . aspirin EC 81 MG tablet Take by mouth.    Marland Kitchen atorvastatin (LIPITOR) 40 MG tablet Take 40 mg by mouth every morning.     Marland Kitchen glimepiride (AMARYL) 2 MG tablet Take 2 mg by mouth daily with breakfast.     . losartan-hydrochlorothiazide (HYZAAR) 100-12.5 MG tablet Take 1 tablet by mouth every morning.     . tamoxifen (NOLVADEX) 20 MG tablet Take 1 tablet (20 mg  total) by mouth daily. 30 tablet 12   No current facility-administered medications for this visit.    Review of Systems Review of Systems  Constitutional: Negative.   Respiratory: Negative.   Cardiovascular: Negative.     Blood pressure 120/60, pulse 68, resp. rate 14, height 5\' 4"  (1.626 m).  Physical Exam Physical Exam  Constitutional: She is oriented to person, place, and time. She appears well-developed and well-nourished.  Pulmonary/Chest:    Right breast incision is clean and healing well.   Neurological: She is alert and oriented to person, place, and time.  Skin: Skin is warm and dry.    Data Reviewed Pathology showed intermediate grade DCIS, ER positive, PR negative. Medial margin was close but negative (is less than 2 mm).  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    Plan    Based on her age I have not recommended post surgery radiation treatment. This was reviewed with the patient's daughter and granddaughter as well.  Indication for antiestrogen therapy discussed. Risks associated with tamoxifen including DVT and vasomotor symptoms reviewed. The patient will continue her daily 81 mg aspirin.  She's been asked to report promptly if she noticed any unexplained leg  swelling.    Discuss Tamoxifen with patient. Rx sent.Patient to return in one month.  PCP:  Cephas Darby, Forest Gleason 05/16/2015, 3:01 PM

## 2015-05-16 NOTE — Patient Instructions (Signed)
Discussed Tamoxifen with patient. Return on one month

## 2015-06-09 NOTE — Addendum Note (Signed)
Encounter addended by: Coral Spikes, Rad Tech on: 06/09/2015  1:17 PM<BR>     Documentation filed: Charges VN

## 2015-06-15 ENCOUNTER — Encounter: Payer: Self-pay | Admitting: General Surgery

## 2015-06-27 ENCOUNTER — Ambulatory Visit (INDEPENDENT_AMBULATORY_CARE_PROVIDER_SITE_OTHER): Payer: Medicare Other | Admitting: General Surgery

## 2015-06-27 ENCOUNTER — Encounter: Payer: Self-pay | Admitting: General Surgery

## 2015-06-27 VITALS — BP 132/70 | HR 72 | Resp 14 | Ht 64.0 in | Wt 201.0 lb

## 2015-06-27 DIAGNOSIS — D0511 Intraductal carcinoma in situ of right breast: Secondary | ICD-10-CM

## 2015-06-27 NOTE — Progress Notes (Signed)
Patient ID: Janet Schwartz, female   DOB: 09-15-1940, 75 y.o.   MRN: [id number]  Chief Complaint  Patient presents with  . Follow-up    right lumpectomy    HPI Janet Schwartz is a 75 y.o. female here today for her one month right lumpectomy done on 05/10/15. Patient states she is doing well on the Tamoxifen.   I personally reviewed the patient's history.   HPI  Past Medical History  Diagnosis Date  . Diabetes mellitus without complication ([place])   . Chronic kidney disease     STAGE 3 DUE TO DM  . Hypertension   . History of hiatal hernia   . GERD (gastroesophageal reflux disease)     RELATED TO SPICY FOODS-NO MEDS  . Complication of anesthesia   . PONV (postoperative nausea and vomiting)     H/O NAUSEA   . 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 Wide excision.    Past Surgical History  Procedure Laterality Date  . Breast biopsy Right 04/20/2015    stereo & u/s biopsy  . Bladder tach  1982  . Cholecystectomy  1987    Dr Bary Castilla  . Abdominal hysterectomy    . Nasal fracture surgery    . Breast lumpectomy with needle localization Right 05/09/2015    Procedure: BREAST LUMPECTOMY WITH NEEDLE LOCALIZATION;  Surgeon: Robert Bellow, MD;  Location: [facility] ORS;  Service: General;  Laterality: Right;    No family history on file.  Social History Social History  Substance Use Topics  . Smoking status: Never Smoker   . Smokeless tobacco: Never Used  . Alcohol Use: No    Allergies  Allergen Reactions  . Ace Inhibitors Cough    Current Outpatient Prescriptions  Medication Sig Dispense Refill  . aspirin EC 81 MG tablet Take by mouth.    Marland Kitchen atorvastatin (LIPITOR) 40 MG tablet Take 40 mg by mouth every morning.     Marland Kitchen glimepiride (AMARYL) 2 MG tablet Take 2 mg by mouth daily with breakfast.     . losartan-hydrochlorothiazide (HYZAAR) 100-12.5 MG tablet Take 1 tablet by mouth every morning.      . tamoxifen (NOLVADEX) 20 MG tablet Take 1 tablet (20 mg total) by mouth daily. 30 tablet 12   No current facility-administered medications for this visit.    Review of Systems Review of Systems  Constitutional: Negative.   Respiratory: Negative.   Cardiovascular: Negative.     Blood pressure 132/70, pulse 72, resp. rate 14, height 5\' 4"  (1.626 m), weight 201 lb (91.173 kg).  Physical Exam Physical Exam  Constitutional: She is oriented to person, place, and time. She appears well-developed and well-nourished.  Cardiovascular: Normal rate, regular rhythm and normal heart sounds.   Pulmonary/Chest: Effort normal and breath sounds normal.    Neurological: She is alert and oriented to person, place, and time.  Skin: Skin is warm and dry.      Assessment    Doing well status post wide excision. Reasonable tolerance of antiestrogen therapy.    Plan     Patient to return in 11 months bilateral diagnotic mammgram PCP:  Ouida Sills This information has been scribed by Gaspar Cola CMA.    Robert Bellow 06/28/2015, 5:56 PM

## 2015-06-27 NOTE — Patient Instructions (Addendum)
Patient to return in 11 months bilateral diagnotic mammgram

## 2015-07-25 NOTE — Addendum Note (Signed)
Encounter addended by: Eloy End on: 07/25/2015  2:51 PM<BR>     Documentation filed: Charges VN

## 2016-02-23 IMAGING — MG US  BREAST BX W/ LOC DEV 1ST LESION IMG BX SPEC US GUIDE*R*
1 series · 8 of 8 positions shown · non-contrast
Comparison: Previous exam(s).

ADDENDUM:
Pathology of the ultrasound guided biopsy of the right breast
revealed NODULAR FIBROSIS WITH INTERMIXED ADIPOSE TISSUE AND BENIGN
BREAST EPITHELIUM, SEE NOTE. NEGATIVE FOR ATYPIA AND MALIGNANCY.
Note: The differential diagnosis for these findings includes benign
hamartoma and nonspecific fibrosis. This was found to be concordant
by Dr. Romy Violet.

Mackennie, Ortell ([REDACTED]) contacted the patient on
04/22/15 for a post biopsy check. The patient stated she has some
mild bruising and soreness, but no bleeding or palpable hematoma.
Post biopsy instructions were reviewed with the patient. She was
encouraged to call the [REDACTED]
stated Dr. Kemppi had contacted her with the results. She is
awaiting an appointment to see Dr. Balki.
Addendum by Mackennie, Ortell on 04/22/15.
CLINICAL DATA: Patient with suspicious mass 12 o'clock position
right breast.
EXAM:
ULTRASOUND GUIDED RIGHT BREAST CORE NEEDLE BIOPSY

[Series 1: MG view · 0.07mm/px · 8 of 8 slices shown]
[im 1/8]
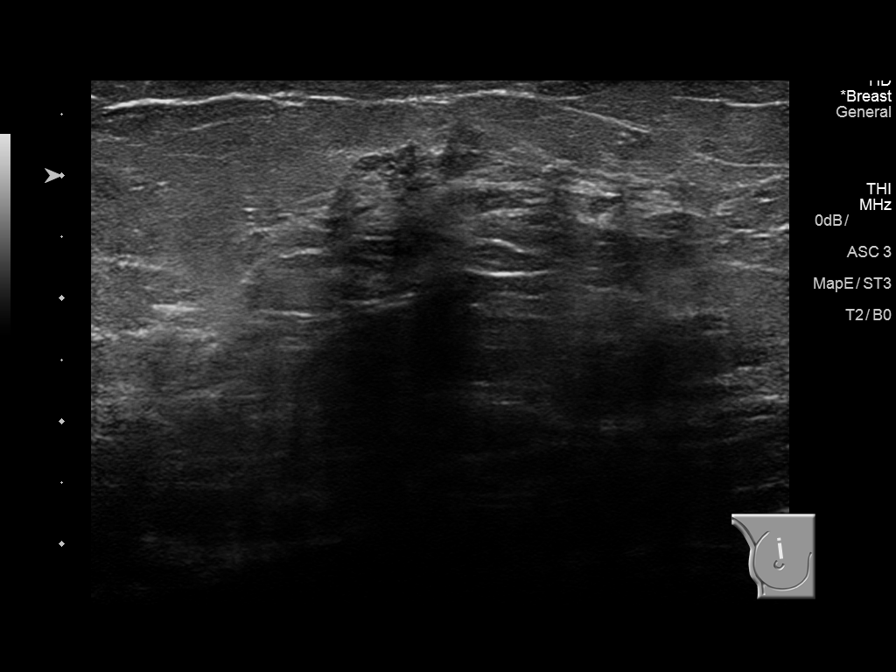
[im 2/8]
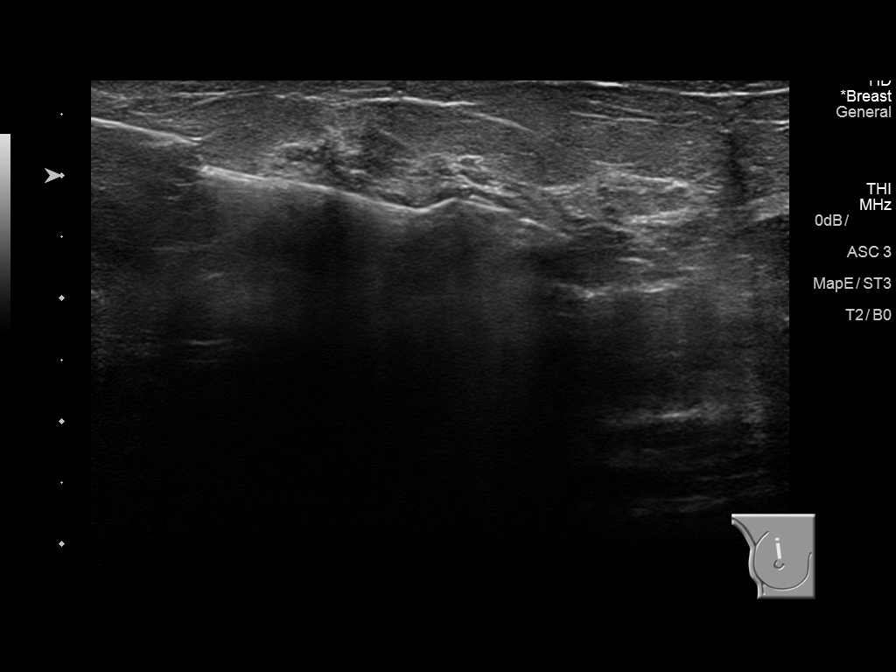
[im 3/8]
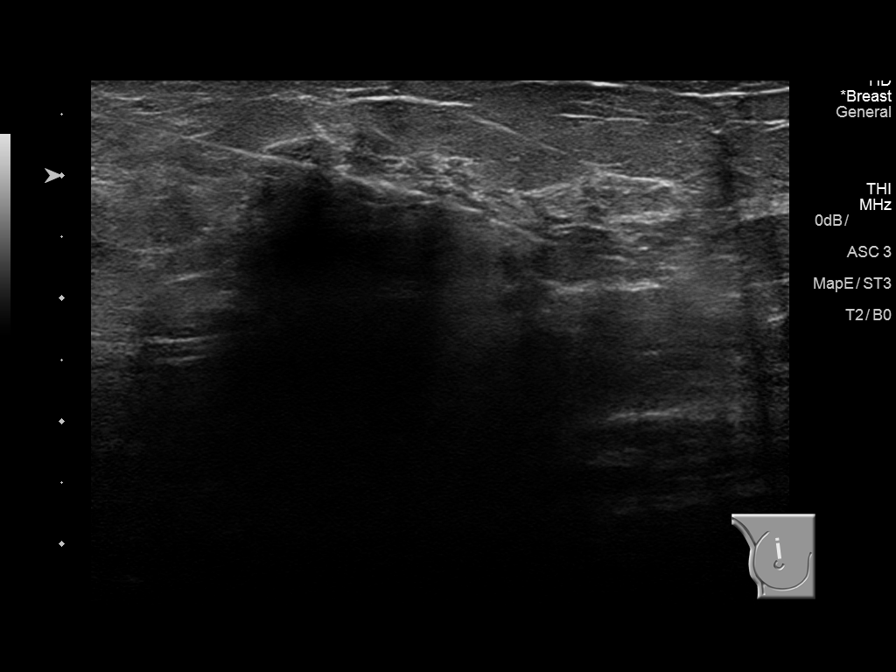
[im 4/8]
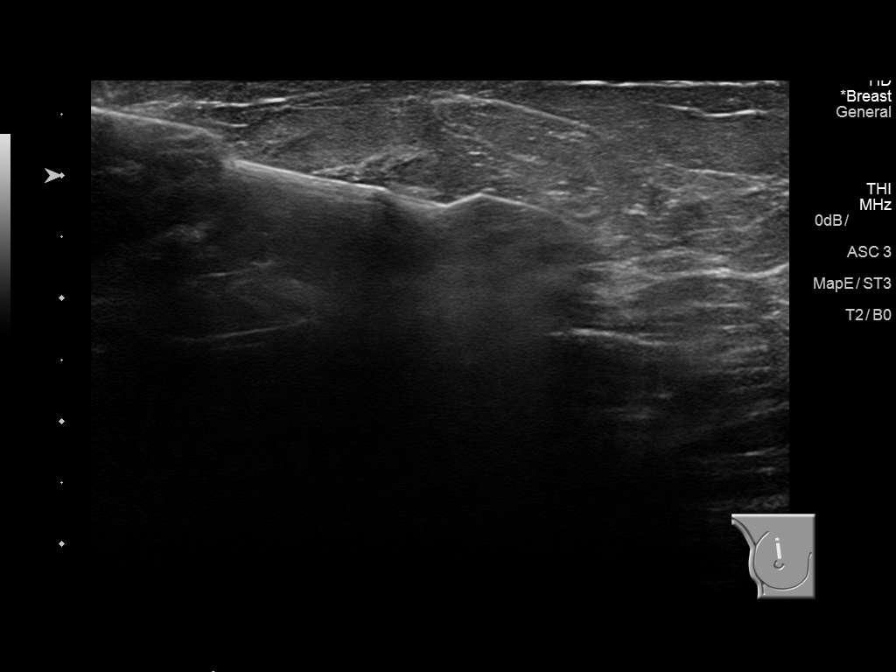
[im 5/8]
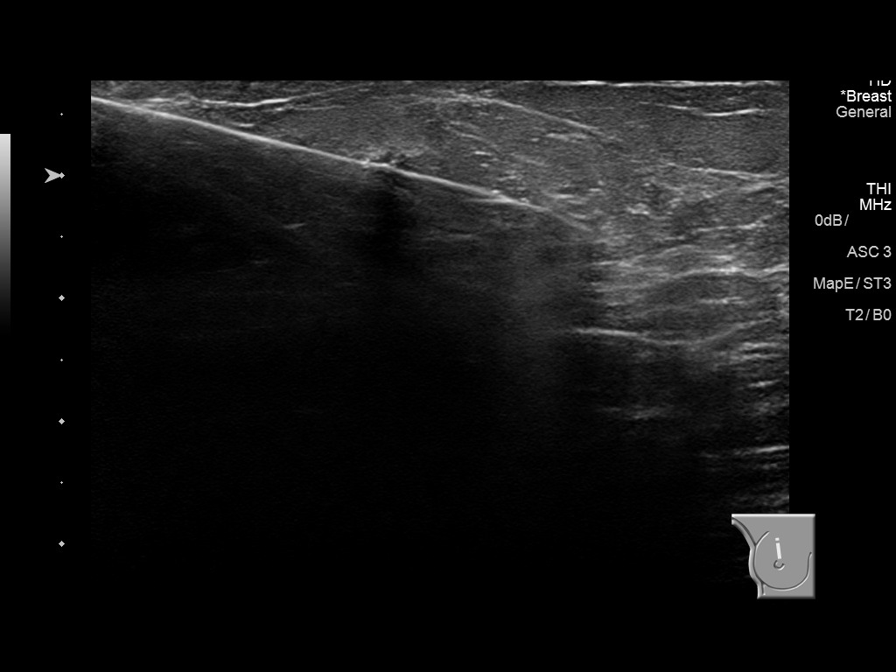
[im 6/8]
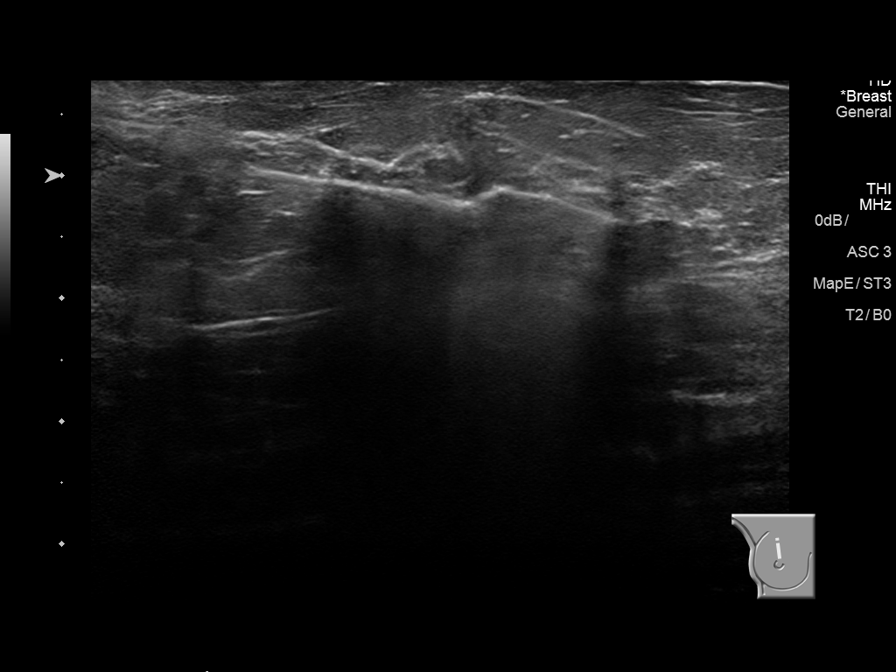
[im 7/8]
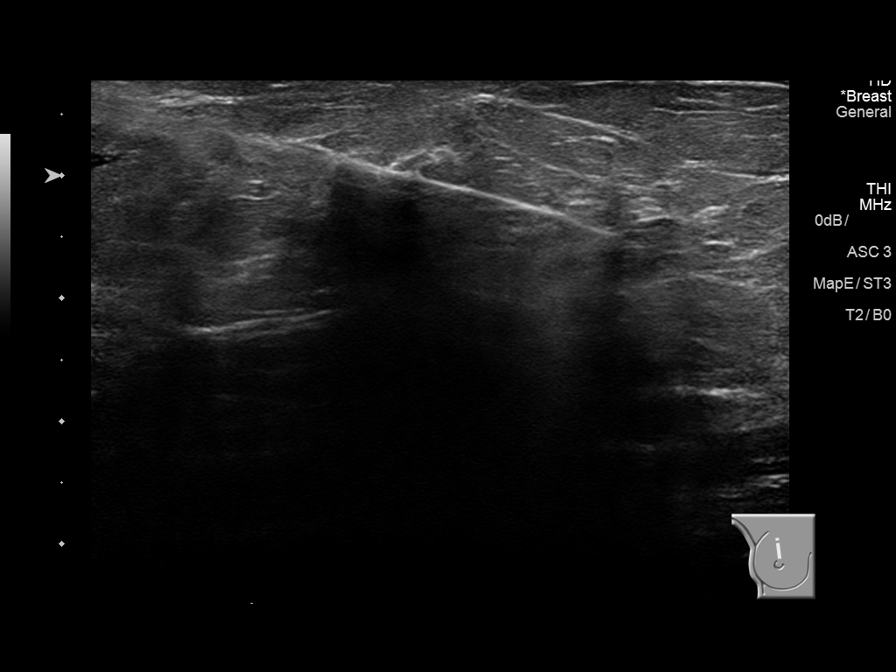
[im 8/8]
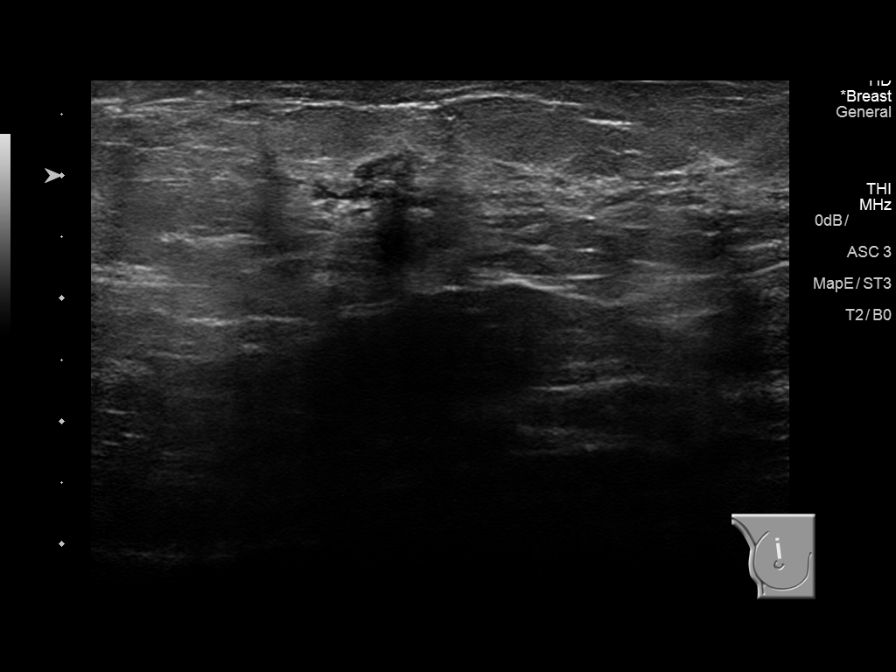

[8 of 8 positions shown; findings below may reference images not displayed]

PROCEDURE:
I met with the patient and we discussed the procedure of
ultrasound-guided biopsy, including benefits and alternatives. We
discussed the high likelihood of a successful procedure. We
discussed the risks of the procedure including infection, bleeding,
tissue injury, clip migration, and inadequate sampling. Informed
written consent was given. The usual time-out protocol was performed
immediately prior to the procedure.

Using sterile technique and 2% Lidocaine as local anesthetic, under
direct ultrasound visualization, a 12 gauge vacuum-assisted device
was used to perform biopsy of right breast mass 12 o'clock
positionusing a lateral approach. At the conclusion of the
procedure, a wing shaped tissue marker clip was deployed into the
biopsy cavity. Follow-up 2-view mammogram was performed and dictated
separately.
IMPRESSION: Ultrasound-guided biopsy of right breast mass 12 o'clock position.
No apparent complications.

## 2016-03-20 ENCOUNTER — Other Ambulatory Visit: Payer: Self-pay

## 2016-03-20 DIAGNOSIS — D0511 Intraductal carcinoma in situ of right breast: Secondary | ICD-10-CM

## 2016-04-28 ENCOUNTER — Other Ambulatory Visit: Payer: Self-pay | Admitting: General Surgery

## 2016-05-14 ENCOUNTER — Encounter: Payer: Self-pay | Admitting: *Deleted

## 2016-05-15 ENCOUNTER — Ambulatory Visit
Admission: RE | Admit: 2016-05-15 | Discharge: 2016-05-15 | Disposition: A | Discharge status: Home / Self Care | Payer: Medicare Other | Source: Ambulatory Visit | Attending: General Surgery | Admitting: General Surgery

## 2016-05-15 ENCOUNTER — Other Ambulatory Visit: Payer: Self-pay | Admitting: General Surgery

## 2016-05-15 DIAGNOSIS — D0511 Intraductal carcinoma in situ of right breast: Secondary | ICD-10-CM

## 2016-05-17 ENCOUNTER — Ambulatory Visit (INDEPENDENT_AMBULATORY_CARE_PROVIDER_SITE_OTHER): Payer: Medicare Other | Admitting: General Surgery

## 2016-05-17 ENCOUNTER — Encounter: Payer: Self-pay | Admitting: General Surgery

## 2016-05-17 VITALS — BP 146/86 | HR 74 | Resp 14 | Ht 64.0 in | Wt 209.0 lb

## 2016-05-17 DIAGNOSIS — D0511 Intraductal carcinoma in situ of right breast: Secondary | ICD-10-CM | POA: Diagnosis not present

## 2016-05-17 NOTE — Progress Notes (Signed)
Patient ID: Janet Schwartz, female   DOB: August 11, 1940, 75 y.o.   MRN: [id number]  Chief Complaint  Patient presents with  . Follow-up    HPI Janet Schwartz is a 75 y.o. female who presents for a breast evaluation. The most recent mammogram was done on 05/15/2016.  Patient does perform regular self breast checks and gets regular mammograms done.  Patient states she has been more constipation in the last year. Moves her bowels every two days.   HPI  Past Medical History:  Diagnosis Date  . 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 Wide excision.  . Cancer ([place]) 70's   cervical  . Chronic kidney disease    STAGE 3 DUE TO DM  . Complication of anesthesia   . Diabetes mellitus without complication ([place])   . GERD (gastroesophageal reflux disease)    RELATED TO SPICY FOODS-NO MEDS  . History of hiatal hernia   . Hypertension   . PONV (postoperative nausea and vomiting)    H/O NAUSEA     Past Surgical History:  Procedure Laterality Date  . ABDOMINAL HYSTERECTOMY    . bladder tach  1982  . BREAST BIOPSY Right 04/20/2015   UIQ-DCIS stereo bx  . BREAST BIOPSY Right 04/20/2015   [place] bx- 12:00 benign with clip  . BREAST LUMPECTOMY WITH NEEDLE LOCALIZATION Right 05/09/2015   Procedure: BREAST LUMPECTOMY WITH NEEDLE LOCALIZATION;  Surgeon: Robert Bellow, MD;  Location: [facility] ORS;  Service: General;  Laterality: Right;  . CHOLECYSTECTOMY  1987   Dr Bary Castilla  . NASAL FRACTURE SURGERY      No family history on file.  Social History Social History  Substance Use Topics  . Smoking status: Never Smoker  . Smokeless tobacco: Never Used  . Alcohol use No    Allergies  Allergen Reactions  . Ace Inhibitors Cough    Current Outpatient Prescriptions  Medication Sig Dispense Refill  . aspirin EC 81 MG tablet Take by mouth.    Marland Kitchen atorvastatin (LIPITOR) 40 MG tablet Take 40 mg by mouth every morning.     . docusate sodium (COLACE) 100 MG  capsule Take 100 mg by mouth 2 (two) times daily.    Marland Kitchen glimepiride (AMARYL) 2 MG tablet Take 2 mg by mouth daily with breakfast.     . losartan-hydrochlorothiazide (HYZAAR) 100-12.5 MG tablet Take 1 tablet by mouth every morning.     . tamoxifen (NOLVADEX) 20 MG tablet TAKE 1 TABLET (20 MG TOTAL) BY MOUTH DAILY. 30 tablet 5   No current facility-administered medications for this visit.     Review of Systems Review of Systems  Constitutional: Negative.   Respiratory: Negative.   Cardiovascular: Negative.   Gastrointestinal: Positive for constipation. Negative for abdominal pain, diarrhea and nausea.    Blood pressure (!) 146/86, pulse 74, resp. rate 14, height 5\' 4"  ([id number] m), weight 209 lb (94.8 kg).  Physical Exam Physical Exam  Constitutional: She is oriented to person, place, and time. She appears well-developed and well-nourished.  Eyes: Conjunctivae are normal. No scleral icterus.  Neck: Neck supple.  Cardiovascular: Normal rate, regular rhythm and normal heart sounds.   Pulmonary/Chest: Effort normal and breath sounds normal. Right breast exhibits no inverted nipple, no mass, no nipple discharge, no skin change and no tenderness. Left breast exhibits no inverted nipple, no mass, no nipple discharge, no skin change and no tenderness.  Abdominal: Soft. Bowel sounds are normal. There is no tenderness.  Lymphadenopathy:    She has no cervical adenopathy.    She has no axillary adenopathy.  Neurological: She is alert and oriented to person, place, and time.  Skin: Skin is warm and dry.    Data Reviewed May 15, 2016 mammograms reviewed. BIRAD 2.   Assessment    Doing well without evidence of recurrent disease.     Plan        Patient will be asked to return to the office in one year with a bilateral diagnotic mammogram. Discussed color guard with patient. The patient will be encouraged to make use of a daily fiber supplement.    This information has been scribed  by Gaspar Cola CMA.    Robert Bellow 05/19/2016, 12:07 PM

## 2016-05-17 NOTE — Patient Instructions (Addendum)
Patient will be asked to return to the office in one year with a bilateral diagnotic mammogram. Discussed color guard with patient.

## 2016-07-05 DIAGNOSIS — Z1211 Encounter for screening for malignant neoplasm of colon: Secondary | ICD-10-CM

## 2016-07-05 HISTORY — DX: Encounter for screening for malignant neoplasm of colon: Z12.11

## 2016-07-17 ENCOUNTER — Telehealth: Payer: Self-pay | Admitting: *Deleted

## 2016-07-17 NOTE — Telephone Encounter (Signed)
Calling to ask about the Cologuard test, if she is going to complete it or not? Yes, she plans on completing the test, she has been sick and the weather has not cooperated and she knows where she can take the box for pickup .

## 2016-07-30 IMAGING — US US BREAST LTD UNI RIGHT INC AXILLA
1 series · 11 of 11 positions shown · non-contrast
Comparison: Previous exam(s).

CLINICAL DATA: Screening recall for possible distortion and
calcifications in the right breast.

EXAM:
DIGITAL DIAGNOSTIC RIGHT MAMMOGRAM WITH 3D TOMOSYNTHESIS AND CAD
RIGHT BREAST ULTRASOUND

[Series 1: us breast ltd uni right inc axilla · 0.07mm/px · 11 of 11 slices shown]
[im 1/11]
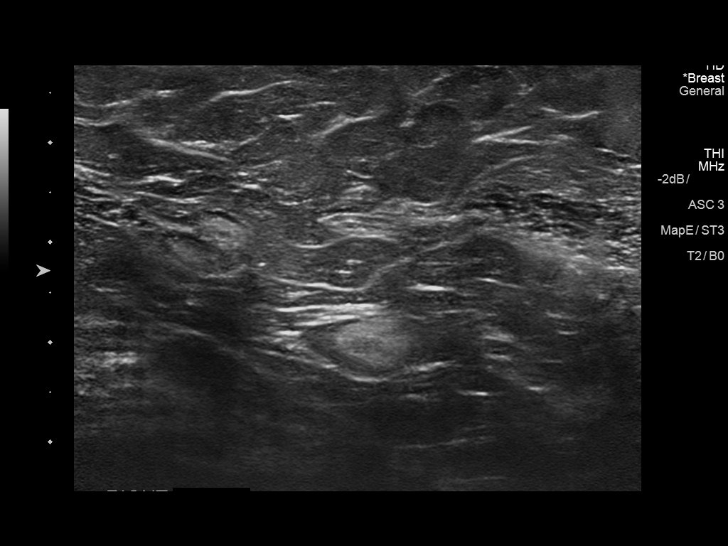
[im 2/11]
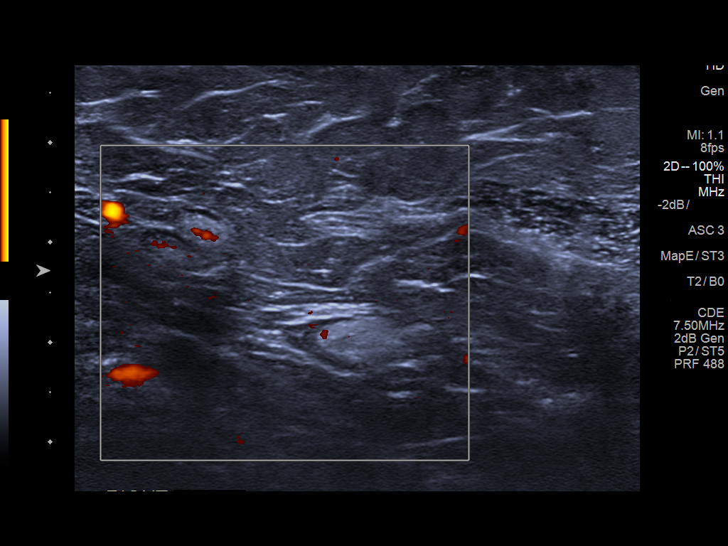
[im 3/11]
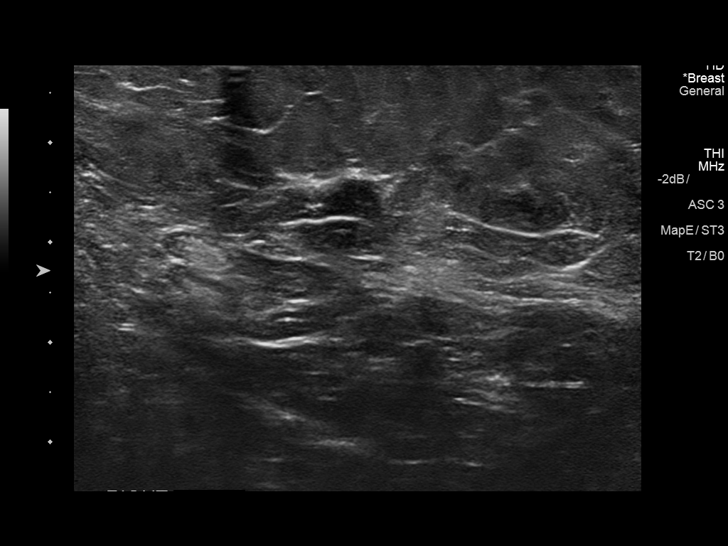
[im 4/11]
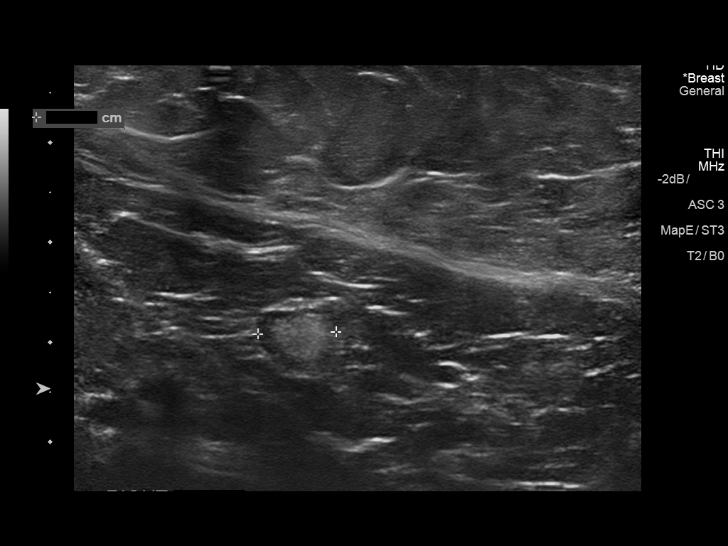
[im 5/11]
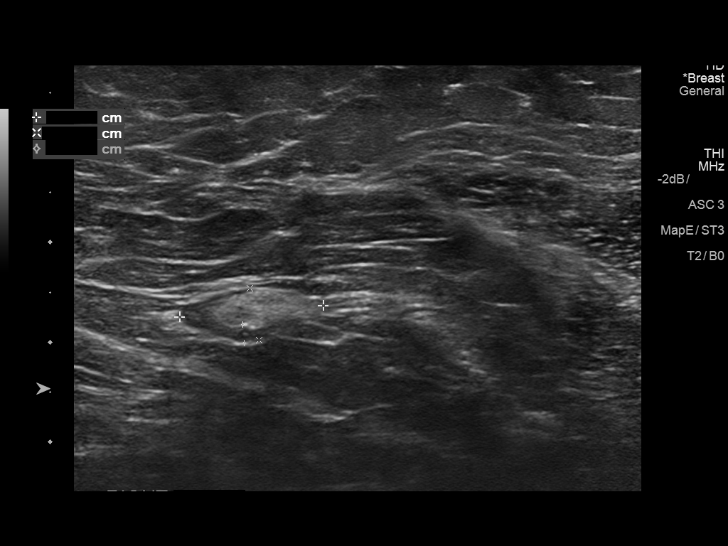
[im 6/11]
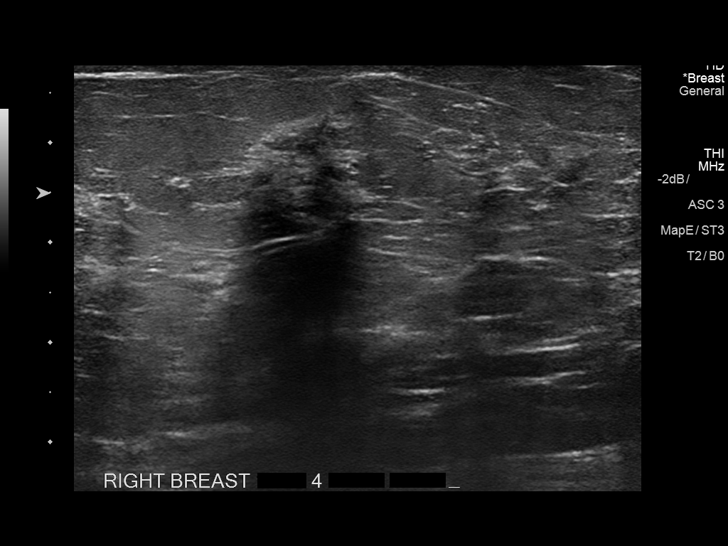
[im 7/11]
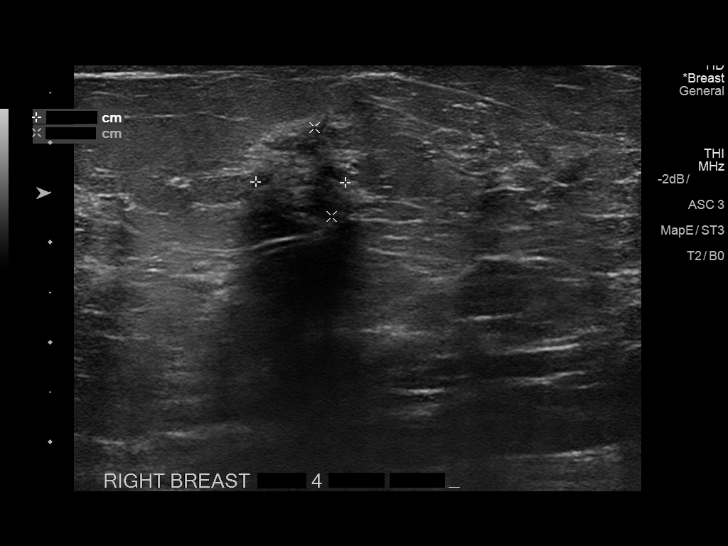
[im 8/11]
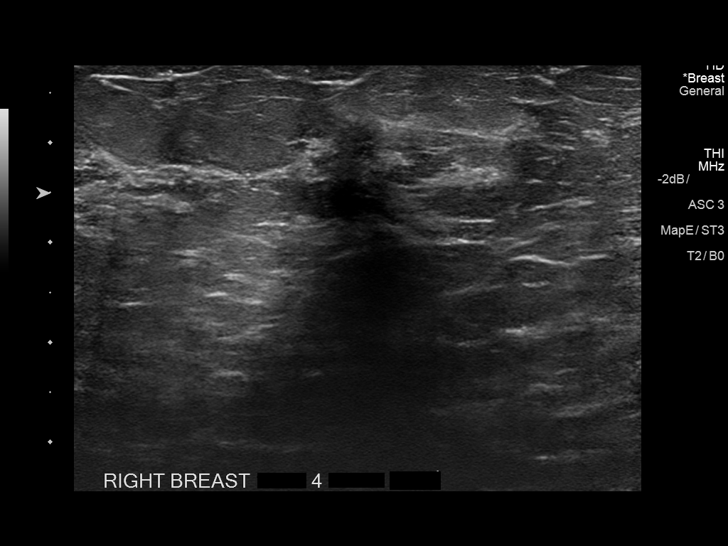
[im 9/11]
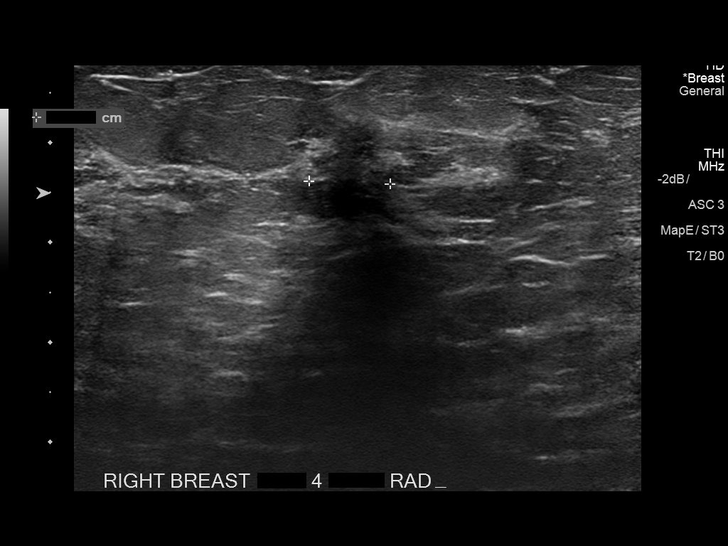
[im 10/11]
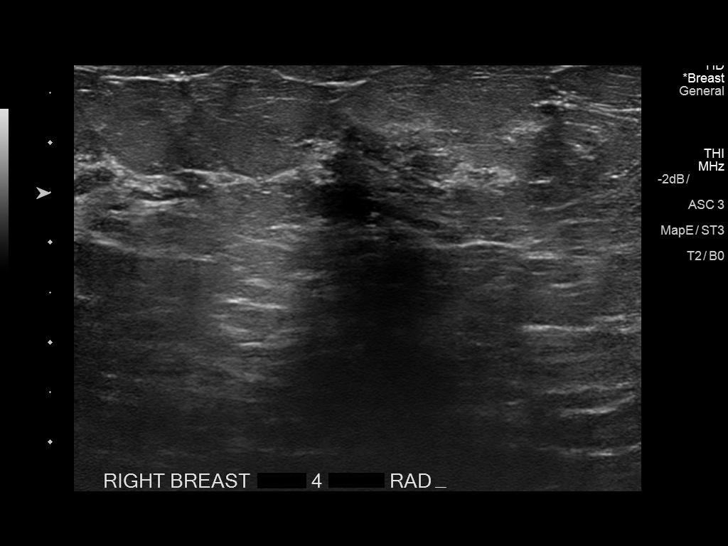
[im 11/11]
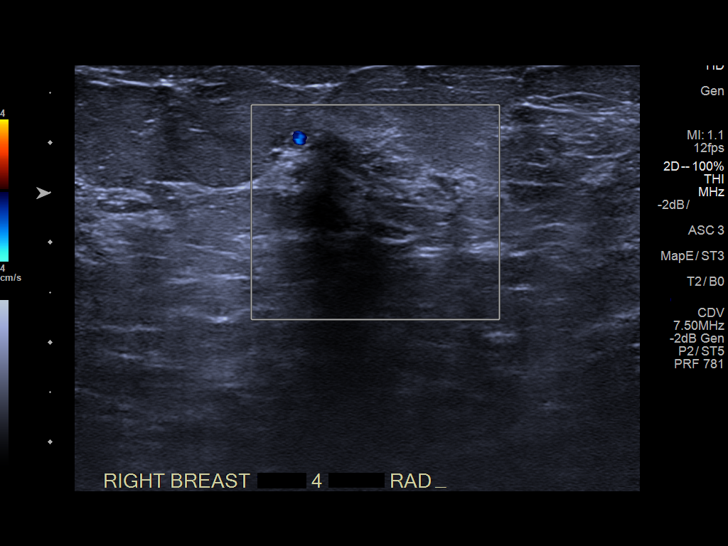

[11 of 11 positions shown; findings below may reference images not displayed]

ACR Breast Density Category b: There are scattered areas of
fibroglandular density.
FINDINGS: Spot compression magnification views were performed of the upper
inner right breast mid to posterior depth. There are suspicious
grouped calcifications varying in shape size and density spanning a
distance of approximately 4 cm. There is an area of
distortion/possible mass in the upper right breast mid to posterior
depth best seen on the initial screening tomosynthesis images.

Mammographic images were processed with CAD.

Physical examination of the upper and the upper inner right breast
does not reveal any definite palpable masses.

Targeted ultrasound of the right breast was performed demonstrating
an irregular shadowing mass/ area of distortion at 12 o'clock 4 cm
from the nipple measuring 0.9 x 0.9 x 0.8 cm. Additional vague areas
of shadowing are seen in the upper inner right breast. No
lymphadenopathy seen in the right axilla.
IMPRESSION: 1. Suspicious calcifications in the upper inner right breast
spanning a distance of approximately 4 cm.

2. Suspicion shadowing mass/area of distortion in the upper right
breast at 12 o'clock.

RECOMMENDATION:
1. Stereotactic guided biopsy of the suspicious calcifications in
the upper inner far posterior right breast is recommended.

2. Ultrasound-guided biopsy of the shadowing mass/distortion in the
right breast at 12 o'clock 4 cm from the nipple is recommended. This
will be scheduled for the patient.

I have discussed the findings and recommendations with the patient.
Results were also provided in writing at the conclusion of the
visit. If applicable, a reminder letter will be sent to the patient
regarding the next appointment.

BI-RADS CATEGORY  4: Suspicious.

## 2016-08-08 ENCOUNTER — Telehealth: Payer: Self-pay

## 2016-08-08 LAB — COLOGUARD

## 2016-08-08 NOTE — Telephone Encounter (Signed)
Notified patient as instructed, patient pleased. Discussed follow-up appointments, patient agrees. States that the fiber supplements have helped.

## 2016-08-08 NOTE — Telephone Encounter (Signed)
-----   Message from Robert Bellow, MD sent at 08/08/2016 10:53 AM EST ----- Please notify the patient and her Colguard testing was negative. See if she is had any improvement with her constipation since adding a fiber supplement.  Follow-up in regards to her breast next year as scheduled.

## 2016-08-09 ENCOUNTER — Encounter: Payer: Self-pay | Admitting: General Surgery

## 2016-10-23 ENCOUNTER — Other Ambulatory Visit: Payer: Self-pay | Admitting: General Surgery

## 2017-03-18 DIAGNOSIS — Z0000 Encounter for general adult medical examination without abnormal findings: Secondary | ICD-10-CM | POA: Insufficient documentation

## 2017-03-26 ENCOUNTER — Other Ambulatory Visit: Payer: Self-pay

## 2017-03-26 DIAGNOSIS — D0511 Intraductal carcinoma in situ of right breast: Secondary | ICD-10-CM

## 2017-04-22 ENCOUNTER — Encounter: Payer: Self-pay | Admitting: *Deleted

## 2017-05-31 ENCOUNTER — Ambulatory Visit
Admission: RE | Admit: 2017-05-31 | Discharge: 2017-05-31 | Disposition: A | Discharge status: Home / Self Care | Payer: Medicare Other | Source: Ambulatory Visit | Attending: General Surgery | Admitting: General Surgery

## 2017-05-31 DIAGNOSIS — D0511 Intraductal carcinoma in situ of right breast: Secondary | ICD-10-CM

## 2017-05-31 DIAGNOSIS — Z853 Personal history of malignant neoplasm of breast: Secondary | ICD-10-CM | POA: Insufficient documentation

## 2017-05-31 DIAGNOSIS — Z9889 Other specified postprocedural states: Secondary | ICD-10-CM | POA: Diagnosis not present

## 2017-06-06 ENCOUNTER — Ambulatory Visit (INDEPENDENT_AMBULATORY_CARE_PROVIDER_SITE_OTHER): Payer: Medicare Other | Admitting: General Surgery

## 2017-06-06 ENCOUNTER — Encounter: Payer: Self-pay | Admitting: General Surgery

## 2017-06-06 VITALS — BP 148/78 | HR 87 | Resp 12 | Ht 64.0 in | Wt 208.0 lb

## 2017-06-06 DIAGNOSIS — D0511 Intraductal carcinoma in situ of right breast: Secondary | ICD-10-CM | POA: Diagnosis not present

## 2017-06-06 NOTE — Progress Notes (Signed)
Patient ID: Janet Schwartz, female   DOB: 1941-01-19, 77 y.o.   MRN: [id number]  Chief Complaint  Patient presents with  . Follow-up    mammogram    HPI Janet Schwartz is a 77 y.o. female.  who presents for her follow up breast cancer and a breast evaluation. The most recent mammogram was done on 05-31-17.  Patient does perform regular self breast checks and gets regular mammograms done.  Doing okay on Tamoxifen.   HPI  Past Medical History:  Diagnosis Date  . Breast cancer ([place]) 2016   DCIS  . 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 Wide excision.  . Cancer ([place]) 70's   cervical  . Chronic kidney disease    STAGE 3 DUE TO DM  . Colon cancer screening 07/2016   Negative Cologuard  . Complication of anesthesia   . Diabetes mellitus without complication ([place])   . GERD (gastroesophageal reflux disease)    RELATED TO SPICY FOODS-NO MEDS  . History of hiatal hernia   . Hypertension   . PONV (postoperative nausea and vomiting)    H/O NAUSEA     Past Surgical History:  Procedure Laterality Date  . ABDOMINAL HYSTERECTOMY    . bladder tach  1982  . BREAST BIOPSY Right 04/20/2015   UIQ-DCIS stereo bx  . BREAST BIOPSY Right 04/20/2015   [place] bx- 12:00 benign with clip  . BREAST LUMPECTOMY Right 2016  . BREAST LUMPECTOMY WITH NEEDLE LOCALIZATION Right 05/09/2015   Procedure: BREAST LUMPECTOMY WITH NEEDLE LOCALIZATION;  Surgeon: Robert Bellow, MD;  Location: [facility] ORS;  Service: General;  Laterality: Right;  . CHOLECYSTECTOMY  1987   Dr Bary Castilla  . NASAL FRACTURE SURGERY      History reviewed. No pertinent family history.  Social History Social History   Tobacco Use  . Smoking status: Never Smoker  . Smokeless tobacco: Never Used  Substance Use Topics  . Alcohol use: No    Alcohol/week: 0.0 oz  . Drug use: No    Allergies  Allergen Reactions  . Ace Inhibitors Cough    Current Outpatient Medications  Medication Sig  Dispense Refill  . aspirin EC 81 MG tablet Take by mouth.    Marland Kitchen atorvastatin (LIPITOR) 40 MG tablet Take 40 mg by mouth every morning.     . docusate sodium (COLACE) 100 MG capsule Take 100 mg by mouth 2 (two) times daily.    Marland Kitchen glimepiride (AMARYL) 2 MG tablet Take 2 mg by mouth daily with breakfast.     . losartan-hydrochlorothiazide (HYZAAR) 100-12.5 MG tablet Take 1 tablet by mouth every morning.     . tamoxifen (NOLVADEX) 20 MG tablet TAKE 1 TABLET (20 MG TOTAL) BY MOUTH DAILY. 30 tablet 5   No current facility-administered medications for this visit.     Review of Systems Review of Systems  Blood pressure (!) 148/78, pulse 87, resp. rate 12, height 5\' 4"  (1.626 m), weight 208 lb (94.3 kg).  Physical Exam Physical Exam  Constitutional: She is oriented to person, place, and time. She appears well-developed and well-nourished.  HENT:  Head:    Eyes: Conjunctivae are normal. No scleral icterus.  Neck: Neck supple.  Cardiovascular: Normal rate, regular rhythm and normal heart sounds.  Pulmonary/Chest: Effort normal and breath sounds normal. Right breast exhibits no inverted nipple, no mass, no nipple discharge, no skin change and no tenderness. Left breast exhibits no inverted nipple, no mass, no nipple discharge,  no skin change and no tenderness.    Lymphadenopathy:    She has no cervical adenopathy.    She has no axillary adenopathy.  Neurological: She is alert and oriented to person, place, and time.  Skin: Skin is warm and dry.  8 mm scalp cyst     Data Reviewed Bilateral mammograms dated May 31, 2017 were reviewed.  Postsurgical changes.  BI-RADS-2.  Assessment    Doing well now 2 years out from treatment of a area of intermediate grade DCIS involving the right breast.    Plan    The patient is amenable to continuing her present tamoxifen therapy.     The patient has been asked to return to the office in one year with a bilateral diagnostic mammogram. The  patient is aware to call back for any questions or concerns.   HPI, Physical Exam, Assessment and Plan have been scribed under the direction and in the presence of Hervey Ard, MD.  Gaspar Cola, CMA  I have completed the exam and reviewed the above documentation for accuracy and completeness.  I agree with the above.  Haematologist has been used and any errors in dictation or transcription are unintentional.  Hervey Ard, M.D., F.A.C.S.  Robert Bellow 06/06/2017, 10:53 AM

## 2017-06-06 NOTE — Patient Instructions (Addendum)
The patient has been asked to return to the office in one year with a bilateral diagnostic mammogram.The patient is aware to call back for any questions or concerns. 

## 2018-02-07 ENCOUNTER — Other Ambulatory Visit: Payer: Self-pay | Admitting: General Surgery

## 2018-04-21 ENCOUNTER — Other Ambulatory Visit: Payer: Self-pay

## 2018-04-21 DIAGNOSIS — D0511 Intraductal carcinoma in situ of right breast: Secondary | ICD-10-CM

## 2018-06-03 ENCOUNTER — Ambulatory Visit
Admission: RE | Admit: 2018-06-03 | Discharge: 2018-06-03 | Disposition: A | Discharge status: Home / Self Care | Payer: Medicare Other | Source: Ambulatory Visit | Attending: General Surgery | Admitting: General Surgery

## 2018-06-03 DIAGNOSIS — D0511 Intraductal carcinoma in situ of right breast: Secondary | ICD-10-CM

## 2018-06-06 ENCOUNTER — Other Ambulatory Visit: Payer: Medicare Other

## 2018-06-12 ENCOUNTER — Ambulatory Visit: Payer: Medicare Other | Admitting: General Surgery

## 2018-07-15 ENCOUNTER — Other Ambulatory Visit: Payer: Self-pay

## 2018-07-15 ENCOUNTER — Encounter: Payer: Self-pay | Admitting: General Surgery

## 2018-07-15 ENCOUNTER — Ambulatory Visit (INDEPENDENT_AMBULATORY_CARE_PROVIDER_SITE_OTHER): Payer: Medicare Other | Admitting: General Surgery

## 2018-07-15 VITALS — BP 124/70 | HR 76 | Temp 97.7°F | Ht 65.0 in | Wt 199.0 lb

## 2018-07-15 DIAGNOSIS — D0511 Intraductal carcinoma in situ of right breast: Secondary | ICD-10-CM

## 2018-07-15 NOTE — Patient Instructions (Signed)
The patient has been asked to return to the office in one year with a bilateral diagnostic mammogram.The patient is aware to call back for any questions or concerns. 

## 2018-07-15 NOTE — Progress Notes (Signed)
Patient ID: Janet Schwartz, female   DOB: [birth year], 78 y.o.   MRN: [id number]  Chief Complaint  Patient presents with  . Follow-up    MAMMOGRAM     HPI Janet Schwartz is a 78 y.o. female who presents for a breast evaluation. The most recent mammogram was done on 06/06/2018  .  Patient does perform regular self breast checks and gets regular mammograms done.    HPI  Past Medical History:  Diagnosis Date  . 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 Intermediate grade, ER positive/PR negative. Wide excision.  . Cancer ([place]) 70's   cervical  . Chronic kidney disease    STAGE 3 DUE TO DM  . Colon cancer screening 07/2016   Negative Cologuard  . Complication of anesthesia   . Diabetes mellitus without complication ([place])   . GERD (gastroesophageal reflux disease)    RELATED TO SPICY FOODS-NO MEDS  . History of hiatal hernia   . Hypertension   . PONV (postoperative nausea and vomiting)    H/O NAUSEA     Past Surgical History:  Procedure Laterality Date  . ABDOMINAL HYSTERECTOMY    . Bladder tack  1982  . BREAST BIOPSY Right 04/20/2015   UIQ-DCIS stereo bx  . BREAST BIOPSY Right 04/20/2015   [place] bx- 12:00 benign with wing clip, NODULAR FIBROSIS WITH INTERMIXED ADIPOSE TISSUE AND BENIGN  . BREAST LUMPECTOMY Right 2016   DCIS  . BREAST LUMPECTOMY WITH NEEDLE LOCALIZATION Right 05/09/2015   Procedure: BREAST LUMPECTOMY WITH NEEDLE LOCALIZATION;  Surgeon: Robert Bellow, MD;  Location: [facility] ORS;  Service: General;  Laterality: Right;  . CHOLECYSTECTOMY  1987   Dr Bary Castilla  . NASAL FRACTURE SURGERY      History reviewed. No pertinent family history.  Social History Social History   Tobacco Use  . Smoking status: Never Smoker  . Smokeless tobacco: Never Used  Substance Use Topics  . Alcohol use: No    Alcohol/week: 0.0 standard drinks  . Drug use: No    Allergies  Allergen Reactions  . Ace Inhibitors Cough    Current Outpatient  Medications  Medication Sig Dispense Refill  . aspirin EC 81 MG tablet Take by mouth.    Marland Kitchen atorvastatin (LIPITOR) 40 MG tablet Take 40 mg by mouth every morning.     . docusate sodium (COLACE) 100 MG capsule Take 100 mg by mouth 2 (two) times daily.    Marland Kitchen glimepiride (AMARYL) 2 MG tablet Take 2 mg by mouth daily with breakfast.     . losartan-hydrochlorothiazide (HYZAAR) 100-12.5 MG tablet Take 1 tablet by mouth every morning.     . tamoxifen (NOLVADEX) 20 MG tablet TAKE 1 TABLET (20 MG TOTAL) BY MOUTH DAILY. 90 tablet 3   No current facility-administered medications for this visit.     Review of Systems Review of Systems  Constitutional: Negative.   Respiratory: Negative.   Cardiovascular: Negative.     Blood pressure 124/70, pulse 76, temperature 97.7 F (36.5 C), temperature source Skin, height 5\' 5"  (1.651 m), weight 199 lb (90.3 kg), SpO2 98 %.  Physical Exam Physical Exam Exam conducted with a chaperone present.  Chest:       Data Reviewed Bilateral diagnostic mammograms dated June 06, 2018 were reviewed.  BI-RADS-2.  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  Good tolerance of tamoxifen.    Plan The patient  has been asked to return to the office in one year with a bilateral diagnostic mammogram.The patient is aware to call back for any questions or concerns.   HPI, Physical Exam, Assessment and Plan have been scribed under the direction and in the presence of Hervey Ard, MD.  Gaspar Cola, CMA  I have completed the exam and reviewed the above documentation for accuracy and completeness.  I agree with the above.  Haematologist has been used and any errors in dictation or transcription are unintentional.  Hervey Ard, M.D., F.A.C.S. [place] Gleason Byrnett 07/16/2018, 11:39 AM

## 2018-07-16 ENCOUNTER — Encounter: Payer: Self-pay | Admitting: General Surgery

## 2018-12-17 ENCOUNTER — Ambulatory Visit (INDEPENDENT_AMBULATORY_CARE_PROVIDER_SITE_OTHER): Payer: Medicare Other

## 2018-12-17 ENCOUNTER — Encounter: Payer: Self-pay | Admitting: Podiatry

## 2018-12-17 ENCOUNTER — Other Ambulatory Visit: Payer: Self-pay

## 2018-12-17 ENCOUNTER — Ambulatory Visit (INDEPENDENT_AMBULATORY_CARE_PROVIDER_SITE_OTHER): Payer: Medicare Other | Admitting: Podiatry

## 2018-12-17 VITALS — Temp 97.4°F

## 2018-12-17 DIAGNOSIS — E1142 Type 2 diabetes mellitus with diabetic polyneuropathy: Secondary | ICD-10-CM | POA: Diagnosis not present

## 2018-12-17 DIAGNOSIS — M722 Plantar fascial fibromatosis: Secondary | ICD-10-CM

## 2018-12-17 DIAGNOSIS — M779 Enthesopathy, unspecified: Secondary | ICD-10-CM

## 2018-12-17 DIAGNOSIS — M19071 Primary osteoarthritis, right ankle and foot: Secondary | ICD-10-CM | POA: Diagnosis not present

## 2018-12-17 DIAGNOSIS — M778 Other enthesopathies, not elsewhere classified: Secondary | ICD-10-CM

## 2018-12-17 NOTE — Progress Notes (Signed)
Subjective:  Patient ID: Janet Schwartz, female    DOB: 1941/01/27,  MRN: [id number] HPI Chief Complaint  Patient presents with  . Foot Pain    Patient presents today for bilat foot pain.  She reports left heel pain x 1-2 years.  She states "my doctor at [facility] told me I had a bone spur and it doesn't hurt all the time"  She also c/o knot on top of right foot x several months and its painful to walk at time   She has taken Tylenol and used Voltaren get with not much relief    78 y.o. female presents with the above complaint.   ROS: She denies fever chills nausea vomiting muscle aches and pains.  Past Medical History:  Diagnosis Date  . 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 Intermediate grade, ER positive/PR negative. Wide excision.  . Cancer ([place]) 70's   cervical  . Chronic kidney disease    STAGE 3 DUE TO DM  . Colon cancer screening 07/2016   Negative Cologuard  . Complication of anesthesia   . Diabetes mellitus without complication ([place])   . GERD (gastroesophageal reflux disease)    RELATED TO SPICY FOODS-NO MEDS  . History of hiatal hernia   . Hypertension   . PONV (postoperative nausea and vomiting)    H/O NAUSEA    Past Surgical History:  Procedure Laterality Date  . ABDOMINAL HYSTERECTOMY    . Bladder tack  1982  . BREAST BIOPSY Right 04/20/2015   UIQ-DCIS stereo bx  . BREAST BIOPSY Right 04/20/2015   [place] bx- 12:00 benign with wing clip, NODULAR FIBROSIS WITH INTERMIXED ADIPOSE TISSUE AND BENIGN  . BREAST LUMPECTOMY Right 2016   DCIS  . BREAST LUMPECTOMY WITH NEEDLE LOCALIZATION Right 05/09/2015   Procedure: BREAST LUMPECTOMY WITH NEEDLE LOCALIZATION;  Surgeon: Robert Bellow, MD;  Location: [facility] ORS;  Service: General;  Laterality: Right;  . CHOLECYSTECTOMY  1987   Dr Bary Castilla  . NASAL FRACTURE SURGERY      Current Outpatient Medications:  .  metFORMIN (GLUCOPHAGE) 1000 MG tablet, TAKE 1 TABLET BY MOUTH TWICE A DAY  WITH MEALS, Disp: , Rfl:  .  aspirin EC 81 MG tablet, Take by mouth., Disp: , Rfl:  .  atorvastatin (LIPITOR) 40 MG tablet, Take 40 mg by mouth every morning. , Disp: , Rfl:  .  docusate sodium (COLACE) 100 MG capsule, Take 100 mg by mouth 2 (two) times daily., Disp: , Rfl:  .  glimepiride (AMARYL) 2 MG tablet, Take 2 mg by mouth daily with breakfast. , Disp: , Rfl:  .  losartan-hydrochlorothiazide (HYZAAR) 100-12.5 MG tablet, Take 1 tablet by mouth every morning. , Disp: , Rfl:  .  tamoxifen (NOLVADEX) 20 MG tablet, TAKE 1 TABLET (20 MG TOTAL) BY MOUTH DAILY., Disp: 90 tablet, Rfl: 3  Allergies  Allergen Reactions  . Ace Inhibitors Cough   Review of Systems Objective:   Vitals:   12/17/18 0900  Temp: (!) 97.4 F (36.3 C)    General: Well developed, nourished, in no acute distress, alert and oriented x3   Dermatological: Skin is warm, dry and supple bilateral. Nails x 10 are well maintained; remaining integument appears unremarkable at this time. There are no open sores, no preulcerative lesions, no rash or signs of infection present.  Vascular: Dorsalis Pedis artery and Posterior Tibial artery pedal pulses are 2/4 bilateral with immedate capillary fill time. Pedal hair growth present. No varicosities  and no lower extremity edema present bilateral.   Neruologic: Grossly intact via light touch bilateral. Vibratory intact via tuning fork bilateral. Protective threshold with Semmes Wienstein monofilament intact to all pedal sites bilateral. Patellar and Achilles deep tendon reflexes 2+ bilateral. No Babinski or clonus noted bilateral.   Musculoskeletal: No gross boney pedal deformities bilateral. No pain, crepitus, or limitation noted with foot and ankle range of motion bilateral. Muscular strength 5/5 in all groups tested bilateral.  Pain on palpation dorsal aspect right foot.  Palpable dorsal spur right foot.  Left foot has severe pain on palpation medial calcaneal tubercle left heel.   Gait: Unassisted, Nonantalgic.    Radiographs:  Radiographs demonstrate dorsal osteoarthritis TMT right foot with hallux rigidus/limitus first metatarsophalangeal joints bilateral she has a plantar distally oriented calcaneal heel spur with soft tissue increase in density plantar fashion calcaneal insertion site of her left heel.  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  Left foot plantar fasciitis.    Plan: Discussed etiology pathology conservative versus surgical therapies.  At this point after sterile Betadine skin prep I injected 20 mg Kenalog 5 mg of Marcaine dorsal aspect of the right foot.  And the plantar medial heel.  Also placed her in a plantar fascial brace left.  Discussed appropriate shoe gear stretching exercises and ice therapy.      [place], [place]

## 2018-12-30 ENCOUNTER — Encounter: Payer: Self-pay | Admitting: General Surgery

## 2019-01-14 ENCOUNTER — Ambulatory Visit (INDEPENDENT_AMBULATORY_CARE_PROVIDER_SITE_OTHER): Payer: Medicare Other | Admitting: Podiatry

## 2019-01-14 ENCOUNTER — Other Ambulatory Visit: Payer: Self-pay

## 2019-01-14 ENCOUNTER — Encounter: Payer: Self-pay | Admitting: Podiatry

## 2019-01-14 VITALS — Temp 97.1°F

## 2019-01-14 DIAGNOSIS — M722 Plantar fascial fibromatosis: Secondary | ICD-10-CM | POA: Diagnosis not present

## 2019-01-14 NOTE — Progress Notes (Signed)
She presents today for follow-up of her bilateral foot pain.  States that they are doing a whole lot better they are 100% better nothing is painful the top of the right was doing great the bottom of the left foot is doing great and very happy.  Objective: Vital signs are stable she is alert and oriented x3.  Pulses are palpable.  No reproducible pain on palpation today either foot.  Assessment: Resolving capsulitis dorsal aspect right foot with osteoarthritic change as well as plantar fasciitis left.  Plan: I encouraged her to use a very small amount of the diclofenac gel that she already has at home on a daily basis to the top of her right foot and the plantar aspect of her left foot whether they hurt or not.  She should use a very small amount we do not want to worsen her chronic kidney disease.

## 2019-02-02 ENCOUNTER — Telehealth: Payer: Self-pay | Admitting: *Deleted

## 2019-02-02 NOTE — Telephone Encounter (Signed)
[facility] requested for her Tamoxifen to be refilled. Advised patient to call her [facility] and make sure she has some refills left. Patient is done for her mammogram in Jan  And office visit in feb 2021. Talk with Dr. Dahlia Byes he states he needs to see her first before he does a refill for this.

## 2019-03-22 ENCOUNTER — Other Ambulatory Visit: Payer: Self-pay | Admitting: General Surgery

## 2019-03-22 DIAGNOSIS — D0511 Intraductal carcinoma in situ of right breast: Secondary | ICD-10-CM

## 2019-06-08 ENCOUNTER — Ambulatory Visit
Admission: RE | Admit: 2019-06-08 | Discharge: 2019-06-08 | Disposition: A | Discharge status: Home / Self Care | Payer: Medicare Other | Source: Ambulatory Visit | Attending: General Surgery | Admitting: General Surgery

## 2019-06-08 DIAGNOSIS — D0511 Intraductal carcinoma in situ of right breast: Secondary | ICD-10-CM

## 2019-06-24 ENCOUNTER — Encounter: Payer: Self-pay | Admitting: Podiatry

## 2019-06-24 ENCOUNTER — Ambulatory Visit (INDEPENDENT_AMBULATORY_CARE_PROVIDER_SITE_OTHER): Payer: Medicare Other | Admitting: Podiatry

## 2019-06-24 ENCOUNTER — Other Ambulatory Visit: Payer: Self-pay

## 2019-06-24 DIAGNOSIS — M19071 Primary osteoarthritis, right ankle and foot: Secondary | ICD-10-CM

## 2019-06-24 DIAGNOSIS — M7662 Achilles tendinitis, left leg: Secondary | ICD-10-CM | POA: Diagnosis not present

## 2019-06-24 DIAGNOSIS — M778 Other enthesopathies, not elsewhere classified: Secondary | ICD-10-CM

## 2019-06-24 NOTE — Progress Notes (Signed)
She presents today for follow-up of her plantar fasciitis and left foot and capsulitis to the dorsal aspect of the right foot.  States that the plantar fasciitis is resolved 100% she has been using Voltaren cream she is not feeling so bad now however she does have pain to the posterior aspect of the left foot.  Objective: Vital signs are stable alert oriented x3.  She has tenderness on palpation of the Achilles as it inserts on the posterior inferior aspect of the calcaneus left.  She also has some tenderness on palpation of the dorsal aspect of the right foot overlying areas of osteoarthritis.  Assessment capsulitis osteoarthritis right foot insertional Achilles tendinitis left foot.  Plan: Dexamethasone was injected 2mg  of dexamethasone was injected subcutaneously at the level of the Achilles tendon insertion.  Also injected the dorsal aspect of the right foot 20 mg Kenalog 5 mg Marcaine.

## 2019-07-30 ENCOUNTER — Encounter: Payer: Self-pay | Admitting: Ophthalmology

## 2019-07-30 ENCOUNTER — Other Ambulatory Visit: Payer: Self-pay

## 2019-07-31 ENCOUNTER — Other Ambulatory Visit
Admission: RE | Admit: 2019-07-31 | Discharge: 2019-07-31 | Disposition: A | Discharge status: Home / Self Care | Payer: Medicare Other | Source: Ambulatory Visit | Attending: Ophthalmology | Admitting: Ophthalmology

## 2019-07-31 DIAGNOSIS — Z01812 Encounter for preprocedural laboratory examination: Secondary | ICD-10-CM | POA: Diagnosis present

## 2019-07-31 DIAGNOSIS — Z20822 Contact with and (suspected) exposure to covid-19: Secondary | ICD-10-CM | POA: Diagnosis not present

## 2019-07-31 LAB — SARS CORONAVIRUS 2 (TAT 6-24 HRS): SARS Coronavirus 2: NEGATIVE

## 2019-07-31 NOTE — Discharge Instructions (Signed)
General Anesthesia, Adult, Care After This sheet gives you information about how to care for yourself after your procedure. Your health care provider may also give you more specific instructions. If you have problems or questions, contact your health care provider. What can I expect after the procedure? After the procedure, the following side effects are common:  Pain or discomfort at the IV site.  Nausea.  Vomiting.  Sore throat.  Trouble concentrating.  Feeling cold or chills.  Weak or tired.  Sleepiness and fatigue.  Soreness and body aches. These side effects can affect parts of the body that were not involved in surgery. Follow these instructions at home:  For at least 24 hours after the procedure:  Have a responsible adult stay with you. It is important to have someone help care for you until you are awake and alert.  Rest as needed.  Do not: ? Participate in activities in which you could fall or become injured. ? Drive. ? Use heavy machinery. ? Drink alcohol. ? Take sleeping pills or medicines that cause drowsiness. ? Make important decisions or sign legal documents. ? Take care of children on your own. Eating and drinking  Follow any instructions from your health care provider about eating or drinking restrictions.  When you feel hungry, start by eating small amounts of foods that are soft and easy to digest (bland), such as toast. Gradually return to your regular diet.  Drink enough fluid to keep your urine pale yellow.  If you vomit, rehydrate by drinking water, juice, or clear broth. General instructions  If you have sleep apnea, surgery and certain medicines can increase your risk for breathing problems. Follow instructions from your health care provider about wearing your sleep device: ? Anytime you are sleeping, including during daytime naps. ? While taking prescription pain medicines, sleeping medicines, or medicines that make you drowsy.  Return to  your normal activities as told by your health care provider. Ask your health care provider what activities are safe for you.  Take over-the-counter and prescription medicines only as told by your health care provider.  If you smoke, do not smoke without supervision.  Keep all follow-up visits as told by your health care provider. This is important. Contact a health care provider if:  You have nausea or vomiting that does not get better with medicine.  You cannot eat or drink without vomiting.  You have pain that does not get better with medicine.  You are unable to pass urine.  You develop a skin rash.  You have a fever.  You have redness around your IV site that gets worse. Get help right away if:  You have difficulty breathing.  You have chest pain.  You have blood in your urine or stool, or you vomit blood. Summary  After the procedure, it is common to have a sore throat or nausea. It is also common to feel tired.  Have a responsible adult stay with you for the first 24 hours after general anesthesia. It is important to have someone help care for you until you are awake and alert.  When you feel hungry, start by eating small amounts of foods that are soft and easy to digest (bland), such as toast. Gradually return to your regular diet.  Drink enough fluid to keep your urine pale yellow.  Return to your normal activities as told by your health care provider. Ask your health care provider what activities are safe for you. This information is not   intended to replace advice given to you by your health care provider. Make sure you discuss any questions you have with your health care provider. Document Revised: 05/24/2017 Document Reviewed: 01/04/2017 Elsevier Patient Education  2020 Elsevier Inc.  Cataract Surgery, Care After This sheet gives you information about how to care for yourself after your procedure. Your health care provider may also give you more specific  instructions. If you have problems or questions, contact your health care provider. What can I expect after the procedure? After the procedure, it is common to have:  Itching.  Discomfort.  Fluid discharge.  Sensitivity to light and to touch.  Bruising in or around the eye.  Mild blurred vision. Follow these instructions at home: Eye care   Do not touch or rub your eyes.  Protect your eyes as told by your health care provider. You may be told to wear a protective eye shield or sunglasses.  Do not put a contact lens into the affected eye or eyes until your health care provider approves.  Keep the area around your eye clean and dry: ? Avoid swimming. ? Do not allow water to hit you directly in the face while showering. ? Keep soap and shampoo out of your eyes.  Check your eye every day for signs of infection. Watch for: ? Redness, swelling, or pain. ? Fluid, blood, or pus. ? Warmth. ? A bad smell. ? Vision that is getting worse. ? Sensitivity that is getting worse. Activity  Do not drive for 24 hours if you were given a sedative during your procedure.  Avoid strenuous activities, such as playing contact sports, for as long as told by your health care provider.  Do not drive or use heavy machinery until your health care provider approves.  Do not bend or lift heavy objects. Bending increases pressure in the eye. You can walk, climb stairs, and do light household chores.  Ask your health care provider when you can return to work. If you work in a dusty environment, you may be advised to wear protective eyewear for a period of time. General instructions  Take or apply over-the-counter and prescription medicines only as told by your health care provider. This includes eye drops.  Keep all follow-up visits as told by your health care provider. This is important. Contact a health care provider if:  You have increased bruising around your eye.  You have pain that is  not helped with medicine.  You have a fever.  You have redness, swelling, or pain in your eye.  You have fluid, blood, or pus coming from your incision.  Your vision gets worse.  Your sensitivity to light gets worse. Get help right away if:  You have sudden loss of vision.  You see flashes of light or spots (floaters).  You have severe eye pain.  You develop nausea or vomiting. Summary  After your procedure, it is common to have itching, discomfort, bruising, fluid discharge, or sensitivity to light.  Follow instructions from your health care provider about caring for your eye after the procedure.  Do not rub your eye after the procedure. You may need to wear eye protection or sunglasses. Do not wear contact lenses. Keep the area around your eye clean and dry.  Avoid activities that require a lot of effort. These include playing sports and lifting heavy objects.  Contact a health care provider if you have increased bruising, pain that does not go away, or a fever. Get help right   away if you suddenly lose your vision, see flashes of light or spots, or have severe pain in the eye. This information is not intended to replace advice given to you by your health care provider. Make sure you discuss any questions you have with your health care provider. Document Revised: 03/17/2019 Document Reviewed: 11/18/2017 Elsevier Patient Education  2020 Elsevier Inc.  

## 2019-08-04 ENCOUNTER — Other Ambulatory Visit: Payer: Self-pay

## 2019-08-04 ENCOUNTER — Encounter
Admission: RE | Disposition: A | Discharge status: Home / Self Care | Payer: Self-pay | Source: Home / Self Care | Attending: Ophthalmology

## 2019-08-04 ENCOUNTER — Ambulatory Visit: Payer: Medicare Other | Admitting: Anesthesiology

## 2019-08-04 ENCOUNTER — Ambulatory Visit
Admission: RE | Admit: 2019-08-04 | Discharge: 2019-08-04 | Disposition: A | Discharge status: Home / Self Care | Payer: Medicare Other | Attending: Ophthalmology | Admitting: Ophthalmology

## 2019-08-04 ENCOUNTER — Encounter: Payer: Self-pay | Admitting: Ophthalmology

## 2019-08-04 DIAGNOSIS — E669 Obesity, unspecified: Secondary | ICD-10-CM | POA: Diagnosis not present

## 2019-08-04 DIAGNOSIS — I10 Essential (primary) hypertension: Secondary | ICD-10-CM | POA: Diagnosis not present

## 2019-08-04 DIAGNOSIS — C50911 Malignant neoplasm of unspecified site of right female breast: Secondary | ICD-10-CM | POA: Insufficient documentation

## 2019-08-04 DIAGNOSIS — M1990 Unspecified osteoarthritis, unspecified site: Secondary | ICD-10-CM | POA: Insufficient documentation

## 2019-08-04 DIAGNOSIS — Z7984 Long term (current) use of oral hypoglycemic drugs: Secondary | ICD-10-CM | POA: Insufficient documentation

## 2019-08-04 DIAGNOSIS — Z7982 Long term (current) use of aspirin: Secondary | ICD-10-CM | POA: Insufficient documentation

## 2019-08-04 DIAGNOSIS — E7800 Pure hypercholesterolemia, unspecified: Secondary | ICD-10-CM | POA: Diagnosis not present

## 2019-08-04 DIAGNOSIS — Z79810 Long term (current) use of selective estrogen receptor modulators (SERMs): Secondary | ICD-10-CM | POA: Diagnosis not present

## 2019-08-04 DIAGNOSIS — H2512 Age-related nuclear cataract, left eye: Secondary | ICD-10-CM | POA: Diagnosis not present

## 2019-08-04 DIAGNOSIS — Z6833 Body mass index (BMI) 33.0-33.9, adult: Secondary | ICD-10-CM | POA: Diagnosis not present

## 2019-08-04 DIAGNOSIS — Z79899 Other long term (current) drug therapy: Secondary | ICD-10-CM | POA: Diagnosis not present

## 2019-08-04 DIAGNOSIS — E1136 Type 2 diabetes mellitus with diabetic cataract: Secondary | ICD-10-CM | POA: Diagnosis not present

## 2019-08-04 HISTORY — DX: Unspecified osteoarthritis, unspecified site: M19.90

## 2019-08-04 HISTORY — PX: CATARACT EXTRACTION W/PHACO: SHX586

## 2019-08-04 LAB — GLUCOSE, CAPILLARY
Glucose-Capillary: 174 mg/dL — ABNORMAL HIGH (ref 70–99)
Glucose-Capillary: 155 mg/dL — ABNORMAL HIGH (ref 70–99)

## 2019-08-04 SURGERY — PHACOEMULSIFICATION, CATARACT, WITH IOL INSERTION
Anesthesia: Monitor Anesthesia Care | Site: Eye | Laterality: Left

## 2019-08-04 MED ORDER — MIDAZOLAM HCL 2 MG/2ML IJ SOLN
INTRAMUSCULAR | Status: DC | PRN
  Administered 2019-08-04 (×2): 1 mg via INTRAVENOUS

## 2019-08-04 MED ORDER — LIDOCAINE HCL (PF) 2 % IJ SOLN
INTRAOCULAR | Status: DC | PRN
  Administered 2019-08-04: 1 mL

## 2019-08-04 MED ORDER — BRIMONIDINE TARTRATE-TIMOLOL 0.2-0.5 % OP SOLN
OPHTHALMIC | Status: DC | PRN
  Administered 2019-08-04: 1 [drp] via OPHTHALMIC

## 2019-08-04 MED ORDER — TETRACAINE HCL 0.5 % OP SOLN
1.0000 [drp] | OPHTHALMIC | Status: DC | PRN
  Administered 2019-08-04 (×3): 1 [drp] via OPHTHALMIC

## 2019-08-04 MED ORDER — ARMC OPHTHALMIC DILATING DROPS
1.0000 "application " | OPHTHALMIC | Status: DC | PRN
  Administered 2019-08-04 (×3): 1 via OPHTHALMIC

## 2019-08-04 MED ORDER — MOXIFLOXACIN HCL 0.5 % OP SOLN
OPHTHALMIC | Status: DC | PRN
  Administered 2019-08-04: 0.2 mL via OPHTHALMIC

## 2019-08-04 MED ORDER — NA CHONDROIT SULF-NA HYALURON 40-17 MG/ML IO SOLN
INTRAOCULAR | Status: DC | PRN
  Administered 2019-08-04: 1 mL via INTRAOCULAR

## 2019-08-04 MED ORDER — EPINEPHRINE PF 1 MG/ML IJ SOLN
INTRAOCULAR | Status: DC | PRN
  Administered 2019-08-04: 58 mL via OPHTHALMIC

## 2019-08-04 MED ORDER — FENTANYL CITRATE (PF) 100 MCG/2ML IJ SOLN
INTRAMUSCULAR | Status: DC | PRN
  Administered 2019-08-04 (×2): 50 ug via INTRAVENOUS

## 2019-08-04 SURGICAL SUPPLY — 20 items
CANNULA ANT/CHMB 27G (MISCELLANEOUS) ×2 IMPLANT
CANNULA ANT/CHMB 27GA (MISCELLANEOUS) ×6 IMPLANT
GLOVE SURG LX 8.0 MICRO (GLOVE) ×2
GLOVE SURG LX STRL 8.0 MICRO (GLOVE) ×1 IMPLANT
GLOVE SURG TRIUMPH 8.0 PF LTX (GLOVE) ×3 IMPLANT
GOWN STRL REUS W/ TWL LRG LVL3 (GOWN DISPOSABLE) ×2 IMPLANT
GOWN STRL REUS W/TWL LRG LVL3 (GOWN DISPOSABLE) ×4
LENS IOL TECNIS ITEC 23.0 (Intraocular Lens) ×2 IMPLANT
MARKER SKIN DUAL TIP RULER LAB (MISCELLANEOUS) ×3 IMPLANT
NDL FILTER BLUNT 18X1 1/2 (NEEDLE) ×1 IMPLANT
NDL RETROBULBAR .5 NSTRL (NEEDLE) ×3 IMPLANT
NEEDLE FILTER BLUNT 18X 1/2SAF (NEEDLE) ×2
NEEDLE FILTER BLUNT 18X1 1/2 (NEEDLE) ×1 IMPLANT
PACK EYE AFTER SURG (MISCELLANEOUS) ×3 IMPLANT
PACK OPTHALMIC (MISCELLANEOUS) ×3 IMPLANT
PACK PORFILIO (MISCELLANEOUS) ×3 IMPLANT
SYR 3ML LL SCALE MARK (SYRINGE) ×3 IMPLANT
SYR TB 1ML LUER SLIP (SYRINGE) ×3 IMPLANT
WATER STERILE IRR 250ML POUR (IV SOLUTION) ×3 IMPLANT
WIPE NON LINTING 3.25X3.25 (MISCELLANEOUS) ×3 IMPLANT

## 2019-08-04 NOTE — H&P (Signed)
All labs reviewed. Abnormal studies sent to patients PCP when indicated.  Previous H&P reviewed, patient examined, there are NO CHANGES.  Janet Taliaferro Porfilio3/2/20217:21 AM

## 2019-08-04 NOTE — Anesthesia Preprocedure Evaluation (Signed)
Anesthesia Evaluation  Patient identified by MRN, date of birth, ID band Patient awake    Reviewed: Allergy & Precautions, NPO status   History of Anesthesia Complications (+) PONV  Airway Mallampati: II  TM Distance: >3 FB     Dental   Pulmonary    breath sounds clear to auscultation       Cardiovascular hypertension,  Rhythm:Regular Rate:Normal     Neuro/Psych    GI/Hepatic hiatal hernia, GERD  ,  Endo/Other  diabetes, Type 2Obesity - BMI 33   Renal/GU Renal InsufficiencyRenal disease     Musculoskeletal  (+) Arthritis ,   Abdominal   Peds  Hematology   Anesthesia Other Findings   Reproductive/Obstetrics                             Anesthesia Physical Anesthesia Plan  ASA: II  Anesthesia Plan: MAC   Post-op Pain Management:    Induction: Intravenous  PONV Risk Score and Plan: TIVA, Midazolam and Treatment may vary due to age or medical condition  Airway Management Planned: Natural Airway and Nasal Cannula  Additional Equipment:   Intra-op Plan:   Post-operative Plan:   Informed Consent: I have reviewed the patients History and Physical, chart, labs and discussed the procedure including the risks, benefits and alternatives for the proposed anesthesia with the patient or authorized representative who has indicated his/her understanding and acceptance.       Plan Discussed with: CRNA  Anesthesia Plan Comments:         Anesthesia Quick Evaluation

## 2019-08-04 NOTE — Transfer of Care (Signed)
Immediate Anesthesia Transfer of Care Note  Patient: Janet Schwartz  Procedure(s) Performed: CATARACT EXTRACTION PHACO AND INTRAOCULAR LENS PLACEMENT (IOC) LEFT DIABETIC 7.25  00:51.6 (Left Eye)  Patient Location: PACU  Anesthesia Type: MAC  Level of Consciousness: awake, alert  and patient cooperative  Airway and Oxygen Therapy: Patient Spontanous Breathing and Patient connected to supplemental oxygen  Post-op Assessment: Post-op Vital signs reviewed, Patient's Cardiovascular Status Stable, Respiratory Function Stable, Patent Airway and No signs of Nausea or vomiting  Post-op Vital Signs: Reviewed and stable  Complications: No apparent anesthesia complications

## 2019-08-04 NOTE — Anesthesia Postprocedure Evaluation (Signed)
Anesthesia Post Note  Patient: Janet Schwartz  Procedure(s) Performed: CATARACT EXTRACTION PHACO AND INTRAOCULAR LENS PLACEMENT (IOC) LEFT DIABETIC 7.25  00:51.6 (Left Eye)     Patient location during evaluation: PACU Anesthesia Type: MAC Level of consciousness: awake Pain management: pain level controlled Vital Signs Assessment: post-procedure vital signs reviewed and stable Respiratory status: respiratory function stable Cardiovascular status: stable Postop Assessment: no apparent nausea or vomiting Anesthetic complications: no    Veda Canning

## 2019-08-04 NOTE — Op Note (Signed)
PREOPERATIVE DIAGNOSIS:  Nuclear sclerotic cataract of the left eye.   POSTOPERATIVE DIAGNOSIS:  Nuclear sclerotic cataract of the left eye.   OPERATIVE PROCEDURE:@   SURGEON:  Birder Robson, MD.   ANESTHESIA:  Anesthesiologist: Veda Canning, MD CRNA: Silvana Newness, CRNA  1.      Managed anesthesia care. 2.     0.54ml of Shugarcaine was instilled following the paracentesis   COMPLICATIONS:  None.   TECHNIQUE:   Stop and chop   DESCRIPTION OF PROCEDURE:  The patient was examined and consented in the preoperative holding area where the aforementioned topical anesthesia was applied to the left eye and then brought back to the Operating Room where the left eye was prepped and draped in the usual sterile ophthalmic fashion and a lid speculum was placed. A paracentesis was created with the side port blade and the anterior chamber was filled with viscoelastic. A near clear corneal incision was performed with the steel keratome. A continuous curvilinear capsulorrhexis was performed with a cystotome followed by the capsulorrhexis forceps. Hydrodissection and hydrodelineation were carried out with BSS on a blunt cannula. The lens was removed in a stop and chop  technique and the remaining cortical material was removed with the irrigation-aspiration handpiece. The capsular bag was inflated with viscoelastic and the Technis [id number] lens was placed in the capsular bag without complication. The remaining viscoelastic was removed from the eye with the irrigation-aspiration handpiece. The wounds were hydrated. The anterior chamber was flushed with BSS and the eye was inflated to physiologic pressure. 0.91ml Vigamox was placed in the anterior chamber. The wounds were found to be water tight. The eye was dressed with Combigan. The patient was given protective glasses to wear throughout the day and a shield with which to sleep tonight. The patient was also given drops with which to begin a drop regimen today and  will follow-up with me in one day. Implant Name Type Inv. Item Serial No. Manufacturer Lot No. LRB No. Used Action  LENS IOL DIOP 23.0 - [id number] Intraocular Lens LENS IOL DIOP 23.0 [id number]  Left 1 Implanted    Procedure(s) with comments: CATARACT EXTRACTION PHACO AND INTRAOCULAR LENS PLACEMENT (IOC) LEFT DIABETIC 7.25  00:51.6 (Left) - Diabetes - oral meds and injectibles  Electronically signed: Birder Robson 08/04/2019 7:50 AM

## 2019-08-18 ENCOUNTER — Encounter: Payer: Self-pay | Admitting: Ophthalmology

## 2019-08-21 ENCOUNTER — Other Ambulatory Visit
Admission: RE | Admit: 2019-08-21 | Discharge: 2019-08-21 | Disposition: A | Discharge status: Home / Self Care | Payer: Medicare Other | Source: Ambulatory Visit | Attending: Ophthalmology | Admitting: Ophthalmology

## 2019-08-21 ENCOUNTER — Other Ambulatory Visit: Payer: Self-pay

## 2019-08-21 DIAGNOSIS — Z01812 Encounter for preprocedural laboratory examination: Secondary | ICD-10-CM | POA: Diagnosis present

## 2019-08-21 DIAGNOSIS — Z20822 Contact with and (suspected) exposure to covid-19: Secondary | ICD-10-CM | POA: Insufficient documentation

## 2019-08-22 LAB — SARS CORONAVIRUS 2 (TAT 6-24 HRS): SARS Coronavirus 2: NEGATIVE

## 2019-08-24 NOTE — Discharge Instructions (Signed)

## 2019-08-25 ENCOUNTER — Ambulatory Visit: Payer: Medicare Other | Admitting: Anesthesiology

## 2019-08-25 ENCOUNTER — Encounter: Payer: Self-pay | Admitting: Ophthalmology

## 2019-08-25 ENCOUNTER — Ambulatory Visit
Admission: RE | Admit: 2019-08-25 | Discharge: 2019-08-25 | Disposition: A | Discharge status: Home / Self Care | Payer: Medicare Other | Attending: Ophthalmology | Admitting: Ophthalmology

## 2019-08-25 ENCOUNTER — Other Ambulatory Visit: Payer: Self-pay

## 2019-08-25 ENCOUNTER — Encounter
Admission: RE | Disposition: A | Discharge status: Home / Self Care | Payer: Self-pay | Source: Home / Self Care | Attending: Ophthalmology

## 2019-08-25 DIAGNOSIS — E1122 Type 2 diabetes mellitus with diabetic chronic kidney disease: Secondary | ICD-10-CM | POA: Insufficient documentation

## 2019-08-25 DIAGNOSIS — Z853 Personal history of malignant neoplasm of breast: Secondary | ICD-10-CM | POA: Diagnosis not present

## 2019-08-25 DIAGNOSIS — I10 Essential (primary) hypertension: Secondary | ICD-10-CM | POA: Diagnosis not present

## 2019-08-25 DIAGNOSIS — H269 Unspecified cataract: Secondary | ICD-10-CM | POA: Diagnosis not present

## 2019-08-25 DIAGNOSIS — E1136 Type 2 diabetes mellitus with diabetic cataract: Secondary | ICD-10-CM | POA: Insufficient documentation

## 2019-08-25 DIAGNOSIS — N189 Chronic kidney disease, unspecified: Secondary | ICD-10-CM | POA: Diagnosis not present

## 2019-08-25 DIAGNOSIS — H2511 Age-related nuclear cataract, right eye: Secondary | ICD-10-CM | POA: Diagnosis not present

## 2019-08-25 HISTORY — PX: CATARACT EXTRACTION W/PHACO: SHX586

## 2019-08-25 LAB — GLUCOSE, CAPILLARY
Glucose-Capillary: 137 mg/dL — ABNORMAL HIGH (ref 70–99)
Glucose-Capillary: 121 mg/dL — ABNORMAL HIGH (ref 70–99)

## 2019-08-25 SURGERY — PHACOEMULSIFICATION, CATARACT, WITH IOL INSERTION
Anesthesia: Monitor Anesthesia Care | Site: Eye | Laterality: Right

## 2019-08-25 MED ORDER — EPINEPHRINE PF 1 MG/ML IJ SOLN
INTRAOCULAR | Status: DC | PRN
  Administered 2019-08-25: 60 mL via OPHTHALMIC

## 2019-08-25 MED ORDER — FENTANYL CITRATE (PF) 100 MCG/2ML IJ SOLN
INTRAMUSCULAR | Status: DC | PRN
  Administered 2019-08-25: 50 ug via INTRAVENOUS

## 2019-08-25 MED ORDER — TETRACAINE HCL 0.5 % OP SOLN
1.0000 [drp] | OPHTHALMIC | Status: DC | PRN
  Administered 2019-08-25 (×3): 1 [drp] via OPHTHALMIC

## 2019-08-25 MED ORDER — LIDOCAINE HCL (PF) 2 % IJ SOLN
INTRAOCULAR | Status: DC | PRN
  Administered 2019-08-25: 1 mL

## 2019-08-25 MED ORDER — ARMC OPHTHALMIC DILATING DROPS
1.0000 "application " | OPHTHALMIC | Status: DC | PRN
  Administered 2019-08-25 (×3): 1 via OPHTHALMIC

## 2019-08-25 MED ORDER — MIDAZOLAM HCL 2 MG/2ML IJ SOLN
INTRAMUSCULAR | Status: DC | PRN
  Administered 2019-08-25: 2 mg via INTRAVENOUS

## 2019-08-25 MED ORDER — MOXIFLOXACIN HCL 0.5 % OP SOLN
OPHTHALMIC | Status: DC | PRN
  Administered 2019-08-25: 0.2 mL via OPHTHALMIC

## 2019-08-25 MED ORDER — NA CHONDROIT SULF-NA HYALURON 40-17 MG/ML IO SOLN
INTRAOCULAR | Status: DC | PRN
  Administered 2019-08-25: 1 mL via INTRAOCULAR

## 2019-08-25 MED ORDER — BRIMONIDINE TARTRATE-TIMOLOL 0.2-0.5 % OP SOLN
OPHTHALMIC | Status: DC | PRN
  Administered 2019-08-25: 1 [drp] via OPHTHALMIC

## 2019-08-25 SURGICAL SUPPLY — 20 items
CANNULA ANT/CHMB 27G (MISCELLANEOUS) ×2 IMPLANT
CANNULA ANT/CHMB 27GA (MISCELLANEOUS) ×6 IMPLANT
DISSECTOR HYDRO NUCLEUS 50X22 (MISCELLANEOUS) ×2 IMPLANT
GLOVE SURG LX 8.0 MICRO (GLOVE) ×2
GLOVE SURG LX STRL 8.0 MICRO (GLOVE) ×1 IMPLANT
GLOVE SURG TRIUMPH 8.0 PF LTX (GLOVE) ×3 IMPLANT
GOWN STRL REUS W/ TWL LRG LVL3 (GOWN DISPOSABLE) ×2 IMPLANT
GOWN STRL REUS W/TWL LRG LVL3 (GOWN DISPOSABLE) ×4
LENS IOL TECNIS ITEC 18.5 (Intraocular Lens) ×2 IMPLANT
MARKER SKIN DUAL TIP RULER LAB (MISCELLANEOUS) ×3 IMPLANT
NDL FILTER BLUNT 18X1 1/2 (NEEDLE) ×1 IMPLANT
NEEDLE FILTER BLUNT 18X 1/2SAF (NEEDLE) ×2
NEEDLE FILTER BLUNT 18X1 1/2 (NEEDLE) ×1 IMPLANT
PACK EYE AFTER SURG (MISCELLANEOUS) ×3 IMPLANT
PACK OPTHALMIC (MISCELLANEOUS) ×3 IMPLANT
PACK PORFILIO (MISCELLANEOUS) ×3 IMPLANT
SYR 3ML LL SCALE MARK (SYRINGE) ×3 IMPLANT
SYR TB 1ML LUER SLIP (SYRINGE) ×3 IMPLANT
WATER STERILE IRR 250ML POUR (IV SOLUTION) ×3 IMPLANT
WIPE NON LINTING 3.25X3.25 (MISCELLANEOUS) ×3 IMPLANT

## 2019-08-25 NOTE — Anesthesia Postprocedure Evaluation (Signed)
Anesthesia Post Note  Patient: Janet Schwartz  Procedure(s) Performed: CATARACT EXTRACTION PHACO AND INTRAOCULAR LENS PLACEMENT (IOC) RIGHT DAIBETIC 9.08  00:51.4 (Right Eye)     Patient location during evaluation: PACU Anesthesia Type: MAC Level of consciousness: awake and alert Pain management: pain level controlled Vital Signs Assessment: post-procedure vital signs reviewed and stable Respiratory status: spontaneous breathing Cardiovascular status: blood pressure returned to baseline Postop Assessment: no apparent nausea or vomiting, adequate PO intake and no headache Anesthetic complications: no    Adele Barthel Eliodoro Gullett

## 2019-08-25 NOTE — Op Note (Signed)
PREOPERATIVE DIAGNOSIS:  Nuclear sclerotic cataract of the right eye.   POSTOPERATIVE DIAGNOSIS:  H25.11 Cataract   OPERATIVE PROCEDURE:@   SURGEON:  Birder Robson, MD.   ANESTHESIA:  Anesthesiologist: Page, Adele Barthel, MD CRNA: Mayme Genta, CRNA  1.      Managed anesthesia care. 2.      0.31ml of Shugarcaine was instilled in the eye following the paracentesis.   COMPLICATIONS:  None.   TECHNIQUE:   Stop and chop   DESCRIPTION OF PROCEDURE:  The patient was examined and consented in the preoperative holding area where the aforementioned topical anesthesia was applied to the right eye and then brought back to the Operating Room where the right eye was prepped and draped in the usual sterile ophthalmic fashion and a lid speculum was placed. A paracentesis was created with the side port blade and the anterior chamber was filled with viscoelastic. A near clear corneal incision was performed with the steel keratome. A continuous curvilinear capsulorrhexis was performed with a cystotome followed by the capsulorrhexis forceps. Hydrodissection and hydrodelineation were carried out with BSS on a blunt cannula. The lens was removed in a stop and chop  technique and the remaining cortical material was removed with the irrigation-aspiration handpiece. The capsular bag was inflated with viscoelastic and the Technis [id number]  lens was placed in the capsular bag without complication. The remaining viscoelastic was removed from the eye with the irrigation-aspiration handpiece. The wounds were hydrated. The anterior chamber was flushed with BSS and the eye was inflated to physiologic pressure. 0.33ml of Vigamox was placed in the anterior chamber. The wounds were found to be water tight. The eye was dressed with Combigan. The patient was given protective glasses to wear throughout the day and a shield with which to sleep tonight. The patient was also given drops with which to begin a drop regimen today and will  follow-up with me in one day. Implant Name Type Inv. Item Serial No. Manufacturer Lot No. LRB No. Used Action  LENS IOL DIOP 18.5 - [id number] Intraocular Lens LENS IOL DIOP 18.5 [id number]  Right 1 Implanted   Procedure(s): CATARACT EXTRACTION PHACO AND INTRAOCULAR LENS PLACEMENT (IOC) RIGHT DAIBETIC 9.08  00:51.4 (Right)  Electronically signed: Birder Robson 08/25/2019 10:44 AM

## 2019-08-25 NOTE — Anesthesia Preprocedure Evaluation (Signed)
Anesthesia Evaluation   Patient awake    Airway Mallampati: III  TM Distance: >3 FB Neck ROM: Full    Dental no notable dental hx.    Pulmonary neg pulmonary ROS,    Pulmonary exam normal        Cardiovascular hypertension, Normal cardiovascular exam     Neuro/Psych negative neurological ROS     GI/Hepatic   Endo/Other  diabetes, Type 2  Renal/GU CRFRenal disease (diabetic renal failure)     Musculoskeletal   Abdominal   Peds  Hematology   Anesthesia Other Findings   Reproductive/Obstetrics                             Anesthesia Physical Anesthesia Plan  ASA: II  Anesthesia Plan: MAC   Post-op Pain Management:    Induction:   PONV Risk Score and Plan: 2 and TIVA, Midazolam and Treatment may vary due to age or medical condition  Airway Management Planned: Nasal Cannula and Natural Airway  Additional Equipment: None  Intra-op Plan:   Post-operative Plan:   Informed Consent: I have reviewed the patients History and Physical, chart, labs and discussed the procedure including the risks, benefits and alternatives for the proposed anesthesia with the patient or authorized representative who has indicated his/her understanding and acceptance.       Plan Discussed with: CRNA  Anesthesia Plan Comments:         Anesthesia Quick Evaluation

## 2019-08-25 NOTE — Anesthesia Procedure Notes (Signed)
Procedure Name: MAC Performed by: Alvy Alsop, CRNA Pre-anesthesia Checklist: Patient identified, Emergency Drugs available, Suction available, Timeout performed and Patient being monitored Patient Re-evaluated:Patient Re-evaluated prior to induction Oxygen Delivery Method: Nasal cannula Placement Confirmation: positive ETCO2       

## 2019-08-25 NOTE — H&P (Signed)
All labs reviewed. Abnormal studies sent to patients PCP when indicated.  Previous H&P reviewed, patient examined, there are NO CHANGES.  Janet Rishel Porfilio3/23/202110:16 AM

## 2019-08-25 NOTE — Transfer of Care (Signed)
Immediate Anesthesia Transfer of Care Note  Patient: Janet Schwartz  Procedure(s) Performed: CATARACT EXTRACTION PHACO AND INTRAOCULAR LENS PLACEMENT (IOC) RIGHT DAIBETIC 9.08  00:51.4 (Right Eye)  Patient Location: PACU  Anesthesia Type: MAC  Level of Consciousness: awake, alert  and patient cooperative  Airway and Oxygen Therapy: Patient Spontanous Breathing and Patient connected to supplemental oxygen  Post-op Assessment: Post-op Vital signs reviewed, Patient's Cardiovascular Status Stable, Respiratory Function Stable, Patent Airway and No signs of Nausea or vomiting  Post-op Vital Signs: Reviewed and stable  Complications: No apparent anesthesia complications

## 2019-08-26 ENCOUNTER — Encounter: Payer: Self-pay | Admitting: *Deleted

## 2019-11-17 DIAGNOSIS — R339 Retention of urine, unspecified: Secondary | ICD-10-CM | POA: Insufficient documentation

## 2020-05-10 ENCOUNTER — Other Ambulatory Visit: Payer: Self-pay | Admitting: General Surgery

## 2020-05-10 DIAGNOSIS — D0511 Intraductal carcinoma in situ of right breast: Secondary | ICD-10-CM

## 2020-05-24 ENCOUNTER — Other Ambulatory Visit: Payer: Self-pay | Admitting: General Surgery

## 2020-05-24 DIAGNOSIS — Z1231 Encounter for screening mammogram for malignant neoplasm of breast: Secondary | ICD-10-CM

## 2020-05-24 DIAGNOSIS — D0511 Intraductal carcinoma in situ of right breast: Secondary | ICD-10-CM

## 2020-05-24 NOTE — Addendum Note (Signed)
Addended byBary Castilla, Neylan Koroma on: 05/24/2020 11:06 AM   Modules accepted: Orders

## 2020-06-01 ENCOUNTER — Ambulatory Visit (INDEPENDENT_AMBULATORY_CARE_PROVIDER_SITE_OTHER): Payer: Medicare Other | Admitting: Podiatry

## 2020-06-01 ENCOUNTER — Encounter: Payer: Self-pay | Admitting: Podiatry

## 2020-06-01 ENCOUNTER — Other Ambulatory Visit: Payer: Self-pay

## 2020-06-01 DIAGNOSIS — M10371 Gout due to renal impairment, right ankle and foot: Secondary | ICD-10-CM

## 2020-06-01 MED ORDER — DEXAMETHASONE SODIUM PHOSPHATE 4 MG/ML IJ SOLN
4.0000 mg | Freq: Once | INTRAMUSCULAR | Status: AC
  Administered 2020-06-01: 4 mg

## 2020-06-01 MED ORDER — COLCHICINE 0.6 MG PO TABS: ORAL_TABLET | ORAL | 2 refills | Status: DC

## 2020-06-01 MED ORDER — TRIAMCINOLONE ACETONIDE 40 MG/ML IJ SUSP
10.0000 mg | Freq: Once | INTRAMUSCULAR | Status: AC
  Administered 2020-06-01: 10 mg

## 2020-06-01 NOTE — Progress Notes (Signed)
  Subjective:  Patient ID: Janet Schwartz, female    DOB: 03/04/1941,  MRN: [id number]  Chief Complaint  Patient presents with  . Foot Pain    Patient presents today for rt foot pain and swelling x 1 week.  She says her foot burns and feels cold and has been red and very painful.  She denied any injury, and has never had gout, but has a family hx of gout.      79 y.o. female presents with the above complaint. History confirmed with patient. She was prescribed prednisone but has not started taking it. Isolated to the first metatarsophalangeal joint. She is currently taking cefdinir for a bladder infection and kidney infection. Here with her granddaughter. She had an x-ray which was reportedly negative for arthritic changes or fracture  Objective:  Physical Exam: warm, good capillary refill, no trophic changes or ulcerative lesions, normal DP and PT pulses and normal sensory exam.  Right Foot: Edema pain and erythema about the first metatarsophalangeal joint of the right foot which is painful with range of motion  Assessment:   1. 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  I do think this is an acute gout attack, this is the first time she has had this. Is likely been exacerbated by her recent bladder and kidney infection reducing her renal function. Recommended treatment with the prednisone she was prescribed, however she would prefer to avoid this as she is a type II diabetic. In which case I recommended a intra-articular steroid injection today which she agreed. Following sterile prep with Betadine, 10 mg of Kenalog and 4 mg of dexamethasone was injected into the right first metatarsal phalangeal joint. She tolerated well and a Band-Aid was applied. Also sent her Rx for colchicine to take 1.2 mg followed by 0.6 mg and then once daily for 1 week. Refills were sent so she has this on hand. Discussed with  her that if this is a recurrent issue she is discussed with her PCP about chronic management such as allopurinol. Purine free diet plan was given to her as well.   Return if symptoms worsen or fail to improve.

## 2020-06-01 NOTE — Patient Instructions (Signed)
Gout  Gout is painful swelling of your joints. Gout is a type of arthritis. It is caused by having too much uric acid in your body. Uric acid is a chemical that is made when your body breaks down substances called purines. If your body has too much uric acid, sharp crystals can form and build up in your joints. This causes pain and swelling. Gout attacks can happen quickly and be very painful (acute gout). Over time, the attacks can affect more joints and happen more often (chronic gout). What are the causes?  Too much uric acid in your blood. This can happen because: ? Your kidneys do not remove enough uric acid from your blood. ? Your body makes too much uric acid. ? You eat too many foods that are high in purines. These foods include organ meats, some seafood, and beer.  Trauma or stress. What increases the risk?  Having a family history of gout.  Being female and middle-aged.  Being female and having gone through menopause.  Being very overweight (obese).  Drinking alcohol, especially beer.  Not having enough water in the body (being dehydrated).  Losing weight too quickly.  Having an organ transplant.  Having lead poisoning.  Taking certain medicines.  Having kidney disease.  Having a skin condition called psoriasis. What are the signs or symptoms? An attack of acute gout usually happens in just one joint. The most common place is the big toe. Attacks often start at night. Other joints that may be affected include joints of the feet, ankle, knee, fingers, wrist, or elbow. Symptoms of an attack may include:  Very bad pain.  Warmth.  Swelling.  Stiffness.  Shiny, red, or purple skin.  Tenderness. The affected joint may be very painful to touch.  Chills and fever. Chronic gout may cause symptoms more often. More joints may be involved. You may also have white or yellow lumps (tophi) on your hands or feet or in other areas near your joints. How is this  treated?  Treatment for this condition has two phases: treating an acute attack and preventing future attacks.  Acute gout treatment may include: ? NSAIDs. ? Steroids. These are taken by mouth or injected into a joint. ? Colchicine. This medicine relieves pain and swelling. It can be given by mouth or through an IV tube.  Preventive treatment may include: ? Taking small doses of NSAIDs or colchicine daily. ? Using a medicine that reduces uric acid levels in your blood. ? Making changes to your diet. You may need to see a food expert (dietitian) about what to eat and drink to prevent gout. Follow these instructions at home: During a gout attack   If told, put ice on the painful area: ? Put ice in a plastic bag. ? Place a towel between your skin and the bag. ? Leave the ice on for 20 minutes, 2-3 times a day.  Raise (elevate) the painful joint above the level of your heart as often as you can.  Rest the joint as much as possible. If the joint is in your leg, you may be given crutches.  Follow instructions from your doctor about what you cannot eat or drink. Avoiding future gout attacks  Eat a low-purine diet. Avoid foods and drinks such as: ? Liver. ? Kidney. ? Anchovies. ? Asparagus. ? Herring. ? Mushrooms. ? Mussels. ? Beer.  Stay at a healthy weight. If you want to lose weight, talk with your doctor. Do not lose weight   too fast.  Start or continue an exercise plan as told by your doctor. Eating and drinking  Drink enough fluids to keep your pee (urine) pale yellow.  If you drink alcohol: ? Limit how much you use to:  0-1 drink a day for women.  0-2 drinks a day for men. ? Be aware of how much alcohol is in your drink. In the [place] one drink equals one 12 oz bottle of beer (355 mL), one 5 oz glass of wine (148 mL), or one 1 oz glass of hard liquor (44 mL). General instructions  Take over-the-counter and prescription medicines only as told by your doctor.  Do  not drive or use heavy machinery while taking prescription pain medicine.  Return to your normal activities as told by your doctor. Ask your doctor what activities are safe for you.  Keep all follow-up visits as told by your doctor. This is important. Contact a doctor if:  You have another gout attack.  You still have symptoms of a gout attack after 10 days of treatment.  You have problems (side effects) because of your medicines.  You have chills or a fever.  You have burning pain when you pee (urinate).  You have pain in your lower back or belly. Get help right away if:  You have very bad pain.  Your pain cannot be controlled.  You cannot pee. Summary  Gout is painful swelling of the joints.  The most common site of pain is the big toe, but it can affect other joints.  Medicines and avoiding some foods can help to prevent and treat gout attacks. This information is not intended to replace advice given to you by your health care provider. Make sure you discuss any questions you have with your health care provider. Document Revised: 12/11/2017 Document Reviewed: 12/11/2017 Elsevier Patient Education  2020 [employer]    Low-Purine Eating Plan A low-purine eating plan involves making food choices to limit your intake of purine. Purine is a kind of uric acid. Too much uric acid in your blood can cause certain conditions, such as gout and kidney stones. Eating a low-purine diet can help control these conditions. What are tips for following this plan? Reading food labels   Avoid foods with saturated or Trans fat.  Check the ingredient list of grains-based foods, such as bread and cereal, to make sure that they contain whole grains.  Check the ingredient list of sauces or soups to make sure they do not contain meat or fish.  When choosing soft drinks, check the ingredient list to make sure they do not contain high-fructose corn syrup. Shopping  Buy plenty of fresh  fruits and vegetables.  Avoid buying canned or fresh fish.  Buy dairy products labeled as low-fat or nonfat.  Avoid buying premade or processed foods. These foods are often high in fat, salt (sodium), and added sugar. Cooking  Use olive oil instead of butter when cooking. Oils like olive oil, canola oil, and sunflower oil contain healthy fats. Meal planning  Learn which foods do or do not affect you. If you find out that a food tends to cause your gout symptoms to flare up, avoid eating that food. You can enjoy foods that do not cause problems. If you have any questions about a food item, talk with your dietitian or health care provider.  Limit foods high in fat, especially saturated fat. Fat makes it harder for your body to get rid of uric acid.    Choose foods that are lower in fat and are lean sources of protein. General guidelines  Limit alcohol intake to no more than 1 drink a day for nonpregnant women and 2 drinks a day for men. One drink equals 12 oz of beer, 5 oz of wine, or 1 oz of hard liquor. Alcohol can affect the way your body gets rid of uric acid.  Drink plenty of water to keep your urine clear or pale yellow. Fluids can help remove uric acid from your body.  If directed by your health care provider, take a vitamin C supplement.  Work with your health care provider and dietitian to develop a plan to achieve or maintain a healthy weight. Losing weight can help reduce uric acid in your blood. What foods are recommended? The items listed may not be a complete list. Talk with your dietitian about what dietary choices are best for you. Foods low in purines Foods low in purines do not need to be limited. These include:  All fruits.  All low-purine vegetables, pickles, and olives.  Breads, pasta, rice, cornbread, and popcorn. Cake and other baked goods.  All dairy foods.  Eggs, nuts, and nut butters.  Spices and condiments, such as salt, herbs, and vinegar.  Plant  oils, butter, and margarine.  Water, sugar-free soft drinks, tea, coffee, and cocoa.  Vegetable-based soups, broths, sauces, and gravies. Foods moderate in purines Foods moderate in purines should be limited to the amounts listed.   cup of asparagus, cauliflower, spinach, mushrooms, or green peas, each day.  2/3 cup uncooked oatmeal, each day.   cup dry wheat bran or wheat germ, each day.  2-3 ounces of meat or poultry, each day.  4-6 ounces of shellfish, such as crab, lobster, oysters, or shrimp, each day.  1 cup cooked beans, peas, or lentils, each day.  Soup, broths, or bouillon made from meat or fish. Limit these foods as much as possible. What foods are not recommended? The items listed may not be a complete list. Talk with your dietitian about what dietary choices are best for you. Limit your intake of foods high in purines, including:  Beer and other alcohol.  Meat-based gravy or sauce.  Canned or fresh fish, such as: ? Anchovies, sardines, herring, and tuna. ? Mussels and scallops. ? Codfish, trout, and haddock.  Bacon.  Organ meats, such as: ? Liver or kidney. ? Tripe. ? Sweetbreads (thymus gland or pancreas).  Wild game or goose.  Yeast or yeast extract supplements.  Drinks sweetened with high-fructose corn syrup. Summary  Eating a low-purine diet can help control conditions caused by too much uric acid in the body, such as gout or kidney stones.  Choose low-purine foods, limit alcohol, and limit foods high in fat.  You will learn over time which foods do or do not affect you. If you find out that a food tends to cause your gout symptoms to flare up, avoid eating that food. This information is not intended to replace advice given to you by your health care provider. Make sure you discuss any questions you have with your health care provider. Document Revised: 05/03/2017 Document Reviewed: 07/04/2016 Elsevier Patient Education  2020 Elsevier  Inc.   

## 2020-06-23 ENCOUNTER — Other Ambulatory Visit: Payer: Self-pay

## 2020-06-23 ENCOUNTER — Ambulatory Visit
Admission: RE | Admit: 2020-06-23 | Discharge: 2020-06-23 | Disposition: A | Discharge status: Home / Self Care | Payer: Medicare Other | Source: Ambulatory Visit | Attending: General Surgery | Admitting: General Surgery

## 2020-06-23 DIAGNOSIS — Z1231 Encounter for screening mammogram for malignant neoplasm of breast: Secondary | ICD-10-CM | POA: Diagnosis present

## 2020-06-29 ENCOUNTER — Encounter: Payer: Self-pay | Admitting: Podiatry

## 2020-06-29 ENCOUNTER — Ambulatory Visit (INDEPENDENT_AMBULATORY_CARE_PROVIDER_SITE_OTHER): Payer: Medicare Other | Admitting: Podiatry

## 2020-06-29 ENCOUNTER — Other Ambulatory Visit: Payer: Self-pay

## 2020-06-29 DIAGNOSIS — M778 Other enthesopathies, not elsewhere classified: Secondary | ICD-10-CM

## 2020-06-29 DIAGNOSIS — M19071 Primary osteoarthritis, right ankle and foot: Secondary | ICD-10-CM

## 2020-06-29 MED ORDER — TRIAMCINOLONE ACETONIDE 40 MG/ML IJ SUSP
20.0000 mg | Freq: Once | INTRAMUSCULAR | Status: AC
  Administered 2020-06-29: 20 mg

## 2020-06-29 NOTE — Progress Notes (Signed)
She presents today for follow-up of pain across the dorsal and dorsal lateral aspect of her right foot.  Dates that she saw Dr. Sherryle Lis her last visit here who diagnosed her with gout and treated her with colchicine and an injection.  She states that everything worked out perfectly well and she was out of pain in a day or so.  She was very upset with her primary care provider who she states basically did nothing.  Objective: Vital signs are stable alert oriented x3.  Pulses are palpable.  There is no erythema edema cellulitis drainage or odor today she does have palpable spurs to the dorsal aspect of the midfoot at the tarsometatarsal joints and at the midtarsal joint.  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  All of this is consistent with gouty arthritis and capsulitis.  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  Plan: She was given enough colchicine previously should gout recur she will notify us immediately and start taking her colchicine.  She also knows that that day we need to get blood work done to verify the concentration of the uric acid so that we can put her on a longer standing medication.  She understands and is amenable to it.  Today I injected the dorsal aspect of the foot with 20 mg Kenalog 5 mg Marcaine point of maximal tenderness 2 different injection sites to the dorsum of the foot and I will follow-up with her as needed.

## 2020-07-01 ENCOUNTER — Ambulatory Visit: Admit: 2020-07-01 | Disposition: A | Discharge status: Home / Self Care | Payer: Self-pay | Source: Home / Self Care

## 2020-11-23 ENCOUNTER — Ambulatory Visit: Payer: Medicare Other | Admitting: Podiatry

## 2021-05-09 ENCOUNTER — Other Ambulatory Visit: Payer: Self-pay | Admitting: General Surgery

## 2021-05-09 DIAGNOSIS — Z1231 Encounter for screening mammogram for malignant neoplasm of breast: Secondary | ICD-10-CM

## 2021-05-09 DIAGNOSIS — D0511 Intraductal carcinoma in situ of right breast: Secondary | ICD-10-CM

## 2022-06-22 ENCOUNTER — Ambulatory Visit: Payer: Medicare Other | Admitting: Urology

## 2022-06-22 ENCOUNTER — Encounter: Payer: Self-pay | Admitting: Urology

## 2022-06-22 VITALS — BP 159/82 | HR 108 | Ht 61.0 in | Wt 174.8 lb

## 2022-06-22 DIAGNOSIS — Z87440 Personal history of urinary (tract) infections: Secondary | ICD-10-CM

## 2022-06-22 DIAGNOSIS — N390 Urinary tract infection, site not specified: Secondary | ICD-10-CM

## 2022-06-22 DIAGNOSIS — Z87898 Personal history of other specified conditions: Secondary | ICD-10-CM

## 2022-06-22 DIAGNOSIS — Z789 Other specified health status: Secondary | ICD-10-CM

## 2022-06-22 DIAGNOSIS — R339 Retention of urine, unspecified: Secondary | ICD-10-CM | POA: Diagnosis not present

## 2022-06-22 DIAGNOSIS — Z229 Carrier of infectious disease, unspecified: Secondary | ICD-10-CM

## 2022-06-22 NOTE — Progress Notes (Signed)
I, DeAsia L Maxie,acting as a scribe for Hollice Espy, MD.,have documented all relevant documentation on the behalf of Hollice Espy, MD,as directed by  Hollice Espy, MD while in the presence of Hollice Espy, MD.   I, [place] as a scribe for Hollice Espy, MD.,have documented all relevant documentation on the behalf of Hollice Espy, MD,as directed by  Hollice Espy, MD while in the presence of Hollice Espy, MD.   06/22/22 12:56 PM   Janet Schwartz [birth year] [id number]  Referring provider: Kirk Ruths, MD [facility] [place],  [place] [address]  Chief Complaint  Patient presents with   Recurrent UTI    HPI: 82 year-old female who presents today to establish care with Urology.   She has a remote history of bladder tack surgery done by Dr. Collins [place] in 1982. Since, she has been managed with self-catheterization for chronic urinary retention.  She believes she has recurrent urinary tract infections over the past ~2 years. She has mulitple urinalysis consistent with chronic bacterial colonization and postive urine cultures. She is treated with antibiotics on a fairly regular basis.  She did have one occation where she was admitted to [facility] with AMS thought to be secondary to UTI but she had no UTI symptoms.  She is seeking to have her urine "checked for infection" even when she is asymptomatic to ensure her infection has cleared.  She denies burring, blood, fevers or chills with her "UTIs".     Most recently she has been placed on suppressive Trimethoprim which has been ordered for the next year which was started on November 30th.   Creatinine is normal of 1.0 on 12/14/21.  She self cath each time she feels the urge to urinate and does not void spontaneously.    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 Intermediate grade, ER positive/PR negative. Wide excision.   Cancer ([place]) 70's   cervical   Chronic kidney disease    STAGE 3 DUE TO DM   Colon cancer screening 07/2016   Negative Cologuard   Complication of anesthesia    Diabetes mellitus without complication (HCC)    GERD (gastroesophageal reflux disease)    RELATED TO SPICY FOODS-NO MEDS   History of hiatal hernia    Hypertension    PONV (postoperative nausea and vomiting)    H/O NAUSEA     Surgical History: Past Surgical History:  Procedure Laterality Date   ABDOMINAL HYSTERECTOMY     Bladder tack  1982   BREAST BIOPSY Right 04/20/2015   UIQ-DCIS stereo bx   BREAST BIOPSY Right 04/20/2015   [place] bx- 12:00 benign with wing clip, NODULAR FIBROSIS WITH INTERMIXED ADIPOSE TISSUE AND BENIGN   BREAST LUMPECTOMY Right 2016   DCIS   BREAST LUMPECTOMY WITH NEEDLE LOCALIZATION Right 05/09/2015   Procedure: BREAST LUMPECTOMY WITH NEEDLE LOCALIZATION;  Surgeon: Robert Bellow, MD;  Location: [facility];  Service: General;  Laterality: Right;   CATARACT EXTRACTION W/PHACO Left 08/04/2019   Procedure: CATARACT EXTRACTION PHACO AND INTRAOCULAR LENS PLACEMENT ([place]) LEFT DIABETIC 7.25  00:51.6;  Surgeon: Birder Robson, MD;  Location: [place];  Service: Ophthalmology;  Laterality: Left;  Diabetes - oral meds and injectibles   CATARACT EXTRACTION W/PHACO Right 08/25/2019   Procedure: CATARACT EXTRACTION PHACO AND INTRAOCULAR LENS  PLACEMENT ([place]) RIGHT DAIBETIC 9.08  00:51.4;  Surgeon: Birder Robson, MD;  Location: [place];  Service: Ophthalmology;  Laterality: Right;   CHOLECYSTECTOMY  1987   Dr Bary Castilla   NASAL FRACTURE SURGERY      Home Medications:  Allergies as of 06/22/2022       Reactions   Ace Inhibitors Cough        Medication List        Accurate as of June 22, 2022 12:56 PM. If you have any questions, ask your nurse or doctor.          STOP taking these medications     cefdinir 300 MG capsule Commonly known as: OMNICEF Stopped by: Hollice Espy, MD   colchicine 0.6 MG tablet Stopped by: Hollice Espy, MD   docusate sodium 100 MG capsule Commonly known as: COLACE Stopped by: Hollice Espy, MD   esomeprazole 20 MG capsule Commonly known as: NEXIUM Stopped by: Hollice Espy, MD   MAGNESIUM-ZINC PO Stopped by: Hollice Espy, MD   tamoxifen 20 MG tablet Commonly known as: NOLVADEX Stopped by: Hollice Espy, MD       TAKE these medications    acetaminophen 325 MG tablet Commonly known as: TYLENOL Take 325 mg by mouth as needed for mild pain.   aspirin EC 81 MG tablet Take by mouth.   atorvastatin 40 MG tablet Commonly known as: LIPITOR Take 40 mg by mouth every morning.   calcium carbonate 1500 (600 Ca) MG Tabs tablet Commonly known as: OSCAL Take 1,500 mg by mouth 2 (two) times daily with a meal.   Dulaglutide 3 MG/0.5ML Sopn Inject 0.5 mLs into the skin once a week. What changed: Another medication with the same name was removed. Continue taking this medication, and follow the directions you see here. Changed by: Hollice Espy, MD   febuxostat 40 MG tablet Commonly known as: ULORIC Take 40 mg by mouth daily.   glimepiride 2 MG tablet Commonly known as: AMARYL Take 2 mg by mouth daily with breakfast.   losartan-hydrochlorothiazide 100-12.5 MG tablet Commonly known as: HYZAAR Take 1 tablet by mouth every morning.   metFORMIN 1000 MG tablet Commonly known as: GLUCOPHAGE Take 1 tablet by mouth 2 (two) times daily with a meal. What changed: Another medication with the same name was removed. Continue taking this medication, and follow the directions you see here. Changed by: Hollice Espy, MD   multivitamin tablet Take 1 tablet by mouth daily.   pantoprazole 40 MG tablet Commonly known as: PROTONIX Take 1 tablet by mouth daily.   trimethoprim 100 MG tablet Commonly known as: TRIMPEX Take 100 mg by mouth  daily.   Vitamin D-1000 Max St 25 MCG (1000 UT) tablet Generic drug: Cholecalciferol Take 1,000 Units by mouth daily. What changed: Another medication with the same name was removed. Continue taking this medication, and follow the directions you see here. Changed by: Hollice Espy, MD        Allergies:  Allergies  Allergen Reactions   Ace Inhibitors Cough    Social History:  reports that she has never smoked. She has never used smokeless tobacco. She reports that she does not drink alcohol and does not use drugs.   Physical Exam: BP (!) 159/82 (BP Location: Left Arm, Patient Position: Sitting, Cuff Size: Large)   Pulse (!) 108   Ht '5\' 1"'$  (1.549 m)   Wt 174 lb 12.8 oz (79.3 kg)   BMI 33.03 kg/m   Constitutional:  Alert  and oriented, No acute distress. HEENT: [place] AT, moist mucus membranes.  Trachea midline, no masses. Neurologic: Grossly intact, no focal deficits, moving all 4 extremities. Psychiatric: Normal mood and affect.  Assessment & Plan:    1. Chronic bacterial colonization - We discussed the pathophysiology of this, avoid treatment unless she's  symptomatic with increased severe urgency or systemic signs of infection. With lower urinary symptoms or signs of systemic inflection, it's reasonable to do suppresive antibiotics but not indefinetely.  Discussed antibiotic stewardship. - She can contine the suppresive Trimethoprim for now.  Will defer to patient whether to continue but will not refill these.    2. Chronic urinary retention - Continue self catheterization -- Cystoscopy was offered to eval bladder in light of chronic CIC (increased bladder cancer risk).  [facility] plan for upper tract imaging in the form of renal ultrasound   Return for cystoscopy in [place].  I have reviewed the above documentation for accuracy and completeness, and I agree with the above.   Hollice Espy, MD   [facility] [address], [place], [place] [address] [phone]

## 2022-06-25 ENCOUNTER — Ambulatory Visit
Admission: RE | Admit: 2022-06-25 | Discharge: 2022-06-25 | Disposition: A | Discharge status: Home / Self Care | Payer: Medicare Other | Source: Ambulatory Visit | Attending: Urology | Admitting: Urology

## 2022-06-25 DIAGNOSIS — Z789 Other specified health status: Secondary | ICD-10-CM | POA: Diagnosis present

## 2022-06-25 DIAGNOSIS — Z87898 Personal history of other specified conditions: Secondary | ICD-10-CM | POA: Insufficient documentation

## 2022-06-25 DIAGNOSIS — N390 Urinary tract infection, site not specified: Secondary | ICD-10-CM | POA: Diagnosis present

## 2022-07-17 ENCOUNTER — Ambulatory Visit (INDEPENDENT_AMBULATORY_CARE_PROVIDER_SITE_OTHER): Payer: Medicare Other | Admitting: Urology

## 2022-07-17 ENCOUNTER — Encounter: Payer: Self-pay | Admitting: Urology

## 2022-07-17 VITALS — BP 108/76 | HR 94 | Ht 63.0 in | Wt 174.0 lb

## 2022-07-17 DIAGNOSIS — N390 Urinary tract infection, site not specified: Secondary | ICD-10-CM

## 2022-07-17 DIAGNOSIS — R3915 Urgency of urination: Secondary | ICD-10-CM | POA: Diagnosis not present

## 2022-07-17 DIAGNOSIS — Z87440 Personal history of urinary (tract) infections: Secondary | ICD-10-CM | POA: Diagnosis not present

## 2022-07-17 DIAGNOSIS — R39198 Other difficulties with micturition: Secondary | ICD-10-CM

## 2022-07-17 DIAGNOSIS — R339 Retention of urine, unspecified: Secondary | ICD-10-CM | POA: Diagnosis not present

## 2022-07-17 LAB — MICROSCOPIC EXAMINATION

## 2022-07-17 LAB — URINALYSIS, COMPLETE
Bilirubin, UA: NEGATIVE
Glucose, UA: NEGATIVE
Ketones, UA: NEGATIVE
Nitrite, UA: NEGATIVE
Protein,UA: NEGATIVE
RBC, UA: NEGATIVE
Specific Gravity, UA: 1.02 (ref 1.005–1.030)
Urobilinogen, Ur: 0.2 mg/dL (ref 0.2–1.0)
pH, UA: 5.5 (ref 5.0–7.5)

## 2022-07-17 NOTE — Progress Notes (Signed)
   07/17/22  CC:  Chief Complaint  Patient presents with   Cysto    HPI: 82 year old female who presents today for cystoscopy.  She has a personal history of chronic urinary retention on self cath which is longstanding.  She has not had a bout of recent infections versus chronic bacterial colonization.  Her UA today is negative.  She worries that she has an infection because she feels a little bit of irritation and some slight increased urgency.  Renal ultrasound did show some bilateral cortical atrophy otherwise no upper tract pathology.  Blood pressure 108/76, pulse 94, height '5\' 3"'$  (1.6 m), weight 174 lb (78.9 kg). NED. A&Ox3.   No respiratory distress   Abd soft, NT, ND Normal external genitalia with patent urethral meatus  Cystoscopy Procedure Note  Patient identification was confirmed, informed consent was obtained, and patient was prepped using Betadine solution.  Lidocaine jelly was administered per urethral meatus.    Procedure: - Flexible cystoscope introduced, without any difficulty.   - Thorough search of the bladder revealed:    normal urethral meatus    normal urothelium    no stones    no ulcers     no tumors    no urethral polyps    no trabeculation  - Ureteral orifices were normal in position and appearance.  -Retroflexion shows some descent of her anterior bladder neck/trigone consistent with mild cystocele.  Post-Procedure: - Patient tolerated the procedure well  Assessment/ Plan:  1. Urinary retention Continue self cath that she has been doing for many years successfully  Cystoscopy today is unremarkable, obstruct imaging is negative - Urinalysis, Complete  2. Recurrent UTI We discussed signs or symptoms of true urinary tract infection, if she has signs or symptoms of this including severe urgency frequency dysuria, fevers altered mental status she should present to our office for evaluation  F/u prn   Hollice Espy, MD   Hollice Espy, MD
